# Patient Record
Sex: Female | Born: 1945
Health system: Southern US, Community
[De-identification: ages and names within clinical notes are randomized; demographics above are authoritative.]

## PROBLEM LIST (undated history)

## (undated) DIAGNOSIS — K219 Gastro-esophageal reflux disease without esophagitis: Secondary | ICD-10-CM

## (undated) DIAGNOSIS — F329 Major depressive disorder, single episode, unspecified: Secondary | ICD-10-CM

## (undated) DIAGNOSIS — F32A Depression, unspecified: Secondary | ICD-10-CM

## (undated) DIAGNOSIS — IMO0002 Reserved for concepts with insufficient information to code with codable children: Secondary | ICD-10-CM

## (undated) DIAGNOSIS — M8580 Other specified disorders of bone density and structure, unspecified site: Secondary | ICD-10-CM

## (undated) DIAGNOSIS — M1990 Unspecified osteoarthritis, unspecified site: Secondary | ICD-10-CM

## (undated) HISTORY — DX: Gastro-esophageal reflux disease without esophagitis: K21.9

## (undated) HISTORY — PX: TONSILLECTOMY AND ADENOIDECTOMY: SUR1326

## (undated) HISTORY — DX: Other specified disorders of bone density and structure, unspecified site: M85.80

## (undated) HISTORY — DX: Unspecified osteoarthritis, unspecified site: M19.90

## (undated) HISTORY — DX: Reserved for concepts with insufficient information to code with codable children: IMO0002

## (undated) HISTORY — DX: Major depressive disorder, single episode, unspecified: F32.9

## (undated) HISTORY — DX: Depression, unspecified: F32.A

---

## 1968-08-09 HISTORY — PX: OTHER SURGICAL HISTORY: SHX169

## 1997-08-09 HISTORY — PX: CHOLECYSTECTOMY: SHX55

## 1998-11-27 ENCOUNTER — Ambulatory Visit (HOSPITAL_BASED_OUTPATIENT_CLINIC_OR_DEPARTMENT_OTHER): Admission: RE | Admit: 1998-11-27 | Discharge: 1998-11-27 | Payer: Self-pay | Admitting: Orthopedic Surgery

## 1999-01-13 ENCOUNTER — Ambulatory Visit (HOSPITAL_BASED_OUTPATIENT_CLINIC_OR_DEPARTMENT_OTHER): Admission: RE | Admit: 1999-01-13 | Discharge: 1999-01-13 | Payer: Self-pay | Admitting: Orthopedic Surgery

## 2012-08-09 HISTORY — PX: CYSTOCELE REPAIR: SHX163

## 2013-01-02 DIAGNOSIS — R350 Frequency of micturition: Secondary | ICD-10-CM | POA: Diagnosis not present

## 2013-01-02 DIAGNOSIS — N949 Unspecified condition associated with female genital organs and menstrual cycle: Secondary | ICD-10-CM | POA: Diagnosis not present

## 2013-01-02 DIAGNOSIS — G4700 Insomnia, unspecified: Secondary | ICD-10-CM | POA: Diagnosis not present

## 2013-01-02 DIAGNOSIS — N8111 Cystocele, midline: Secondary | ICD-10-CM | POA: Diagnosis not present

## 2013-01-07 DIAGNOSIS — IMO0001 Reserved for inherently not codable concepts without codable children: Secondary | ICD-10-CM

## 2013-01-07 HISTORY — DX: Reserved for inherently not codable concepts without codable children: IMO0001

## 2013-01-13 DIAGNOSIS — L255 Unspecified contact dermatitis due to plants, except food: Secondary | ICD-10-CM | POA: Diagnosis not present

## 2013-01-16 DIAGNOSIS — Z01818 Encounter for other preprocedural examination: Secondary | ICD-10-CM | POA: Diagnosis not present

## 2013-01-22 DIAGNOSIS — N8111 Cystocele, midline: Secondary | ICD-10-CM | POA: Diagnosis not present

## 2013-01-22 DIAGNOSIS — Z884 Allergy status to anesthetic agent status: Secondary | ICD-10-CM | POA: Diagnosis not present

## 2013-01-22 DIAGNOSIS — Z9889 Other specified postprocedural states: Secondary | ICD-10-CM | POA: Diagnosis not present

## 2013-01-22 DIAGNOSIS — N8110 Cystocele, unspecified: Secondary | ICD-10-CM | POA: Diagnosis not present

## 2013-01-22 DIAGNOSIS — Z79899 Other long term (current) drug therapy: Secondary | ICD-10-CM | POA: Diagnosis not present

## 2013-01-23 DIAGNOSIS — Z79899 Other long term (current) drug therapy: Secondary | ICD-10-CM | POA: Diagnosis not present

## 2013-01-23 DIAGNOSIS — Z9889 Other specified postprocedural states: Secondary | ICD-10-CM | POA: Diagnosis not present

## 2013-01-23 DIAGNOSIS — N8111 Cystocele, midline: Secondary | ICD-10-CM | POA: Diagnosis not present

## 2013-01-23 DIAGNOSIS — Z884 Allergy status to anesthetic agent status: Secondary | ICD-10-CM | POA: Diagnosis not present

## 2013-04-17 DIAGNOSIS — N318 Other neuromuscular dysfunction of bladder: Secondary | ICD-10-CM | POA: Diagnosis not present

## 2013-04-17 DIAGNOSIS — N950 Postmenopausal bleeding: Secondary | ICD-10-CM | POA: Diagnosis not present

## 2013-05-07 ENCOUNTER — Other Ambulatory Visit: Payer: Self-pay

## 2013-05-07 DIAGNOSIS — Z1231 Encounter for screening mammogram for malignant neoplasm of breast: Secondary | ICD-10-CM

## 2013-05-08 ENCOUNTER — Other Ambulatory Visit: Payer: Self-pay | Admitting: Obstetrics and Gynecology

## 2013-05-08 DIAGNOSIS — N950 Postmenopausal bleeding: Secondary | ICD-10-CM

## 2013-05-15 ENCOUNTER — Ambulatory Visit
Admission: RE | Admit: 2013-05-15 | Discharge: 2013-05-15 | Disposition: A | Discharge status: Home / Self Care | Payer: Medicare Other | Source: Ambulatory Visit | Attending: Obstetrics and Gynecology | Admitting: Obstetrics and Gynecology

## 2013-05-15 DIAGNOSIS — N950 Postmenopausal bleeding: Secondary | ICD-10-CM

## 2013-05-15 DIAGNOSIS — K651 Peritoneal abscess: Secondary | ICD-10-CM | POA: Diagnosis not present

## 2013-05-17 ENCOUNTER — Ambulatory Visit
Admission: RE | Admit: 2013-05-17 | Discharge: 2013-05-17 | Disposition: A | Discharge status: Home / Self Care | Payer: Medicare Other | Source: Ambulatory Visit

## 2013-05-17 DIAGNOSIS — Z1231 Encounter for screening mammogram for malignant neoplasm of breast: Secondary | ICD-10-CM

## 2013-09-10 DIAGNOSIS — H1045 Other chronic allergic conjunctivitis: Secondary | ICD-10-CM | POA: Diagnosis not present

## 2013-09-10 DIAGNOSIS — H2589 Other age-related cataract: Secondary | ICD-10-CM | POA: Diagnosis not present

## 2013-09-10 DIAGNOSIS — H11159 Pinguecula, unspecified eye: Secondary | ICD-10-CM | POA: Diagnosis not present

## 2014-04-09 DIAGNOSIS — G5760 Lesion of plantar nerve, unspecified lower limb: Secondary | ICD-10-CM | POA: Diagnosis not present

## 2014-07-30 ENCOUNTER — Ambulatory Visit (INDEPENDENT_AMBULATORY_CARE_PROVIDER_SITE_OTHER): Payer: Medicare Other | Admitting: Family Medicine

## 2014-07-30 ENCOUNTER — Encounter: Payer: Self-pay | Admitting: Family Medicine

## 2014-07-30 VITALS — BP 122/63 | HR 80 | Ht 63.0 in | Wt 144.0 lb

## 2014-07-30 DIAGNOSIS — F32A Depression, unspecified: Secondary | ICD-10-CM | POA: Insufficient documentation

## 2014-07-30 DIAGNOSIS — G4700 Insomnia, unspecified: Secondary | ICD-10-CM | POA: Diagnosis not present

## 2014-07-30 DIAGNOSIS — F329 Major depressive disorder, single episode, unspecified: Secondary | ICD-10-CM | POA: Diagnosis not present

## 2014-07-30 DIAGNOSIS — L299 Pruritus, unspecified: Secondary | ICD-10-CM | POA: Diagnosis not present

## 2014-07-30 DIAGNOSIS — F5102 Adjustment insomnia: Secondary | ICD-10-CM | POA: Insufficient documentation

## 2014-07-30 DIAGNOSIS — F5104 Psychophysiologic insomnia: Secondary | ICD-10-CM | POA: Insufficient documentation

## 2014-07-30 MED ORDER — TRAZODONE HCL 50 MG PO TABS
25.0000 mg | ORAL_TABLET | Freq: Every evening | ORAL | Status: DC | PRN
Start: 2014-07-30 — End: 2014-09-10
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
## 2014-07-30 NOTE — Patient Instructions (Signed)
Insomnia Insomnia is frequent trouble falling and/or staying asleep. Insomnia can be a long term problem or a short term problem. Both are common. Insomnia can be a short term problem when the wakefulness is related to a certain stress or worry. Long term insomnia is often related to ongoing stress during waking hours and/or poor sleeping habits. Overtime, sleep deprivation itself can make the problem worse. Every little thing feels more severe because you are overtired and your ability to cope is decreased. CAUSES   Stress, anxiety, and depression.  Poor sleeping habits.  Distractions such as TV in the bedroom.  Naps close to bedtime.  Engaging in emotionally charged conversations before bed.  Technical reading before sleep.  Alcohol and other sedatives. They may make the problem worse. They can hurt normal sleep patterns and normal dream activity.  Stimulants such as caffeine for several hours prior to bedtime.  Pain syndromes and shortness of breath can cause insomnia.  Exercise late at night.  Changing time zones may cause sleeping problems (jet lag). It is sometimes helpful to have someone observe your sleeping patterns. They should look for periods of not breathing during the night (sleep apnea). They should also look to see how long those periods last. If you live alone or observers are uncertain, you can also be observed at a sleep clinic where your sleep patterns will be professionally monitored. Sleep apnea requires a checkup and treatment. Give your caregivers your medical history. Give your caregivers observations your family has made about your sleep.  SYMPTOMS   Not feeling rested in the morning.  Anxiety and restlessness at bedtime.  Difficulty falling and staying asleep. TREATMENT   Your caregiver may prescribe treatment for an underlying medical disorders. Your caregiver can give advice or help if you are using alcohol or other drugs for self-medication. Treatment  of underlying problems will usually eliminate insomnia problems.  Medications can be prescribed for short time use. They are generally not recommended for lengthy use.  Over-the-counter sleep medicines are not recommended for lengthy use. They can be habit forming.  You can promote easier sleeping by making lifestyle changes such as:  Using relaxation techniques that help with breathing and reduce muscle tension.  Exercising earlier in the day.  Changing your diet and the time of your last meal. No night time snacks.  Establish a regular time to go to bed.  Counseling can help with stressful problems and worry.  Soothing music and white noise may be helpful if there are background noises you cannot remove.  Stop tedious detailed work at least one hour before bedtime. HOME CARE INSTRUCTIONS   Keep a diary. Inform your caregiver about your progress. This includes any medication side effects. See your caregiver regularly. Take note of:  Times when you are asleep.  Times when you are awake during the night.  The quality of your sleep.  How you feel the next day. This information will help your caregiver care for you.  Get out of bed if you are still awake after 15 minutes. Read or do some quiet activity. Keep the lights down. Wait until you feel sleepy and go back to bed.  Keep regular sleeping and waking hours. Avoid naps.  Exercise regularly.  Avoid distractions at bedtime. Distractions include watching television or engaging in any intense or detailed activity like attempting to balance the household checkbook.  Develop a bedtime ritual. Keep a familiar routine of bathing, brushing your teeth, climbing into bed at the same   time each night, listening to soothing music. Routines increase the success of falling to sleep faster.  Use relaxation techniques. This can be using breathing and muscle tension release routines. It can also include visualizing peaceful scenes. You can  also help control troubling or intruding thoughts by keeping your mind occupied with boring or repetitive thoughts like the old concept of counting sheep. You can make it more creative like imagining planting one beautiful flower after another in your backyard garden.  During your day, work to eliminate stress. When this is not possible use some of the previous suggestions to help reduce the anxiety that accompanies stressful situations. MAKE SURE YOU:   Understand these instructions.  Will watch your condition.  Will get help right away if you are not doing well or get worse. Document Released: 07/23/2000 Document Revised: 10/18/2011 Document Reviewed: 08/23/2007 ExitCare Patient Information 2015 [employer]. This information is not intended to replace advice given to you by your health care provider. Make sure you discuss any questions you have with your health care provider.  

## 2014-07-31 LAB — TSH: TSH: 1.538 u[IU]/mL (ref 0.350–4.500)

## 2014-08-05 ENCOUNTER — Encounter: Payer: Self-pay | Admitting: Family Medicine

## 2014-09-09 LAB — HM DEXA SCAN

## 2014-09-10 ENCOUNTER — Encounter: Payer: Self-pay | Admitting: Family Medicine

## 2014-09-10 ENCOUNTER — Ambulatory Visit (INDEPENDENT_AMBULATORY_CARE_PROVIDER_SITE_OTHER): Payer: Medicare Other | Admitting: Family Medicine

## 2014-09-10 VITALS — BP 134/69 | HR 65 | Ht 63.0 in | Wt 142.0 lb

## 2014-09-10 DIAGNOSIS — Z780 Asymptomatic menopausal state: Secondary | ICD-10-CM

## 2014-09-10 DIAGNOSIS — F32A Depression, unspecified: Secondary | ICD-10-CM

## 2014-09-10 DIAGNOSIS — Z0000 Encounter for general adult medical examination without abnormal findings: Secondary | ICD-10-CM | POA: Diagnosis not present

## 2014-09-10 DIAGNOSIS — F329 Major depressive disorder, single episode, unspecified: Secondary | ICD-10-CM

## 2014-09-10 DIAGNOSIS — Z1231 Encounter for screening mammogram for malignant neoplasm of breast: Secondary | ICD-10-CM

## 2014-09-10 DIAGNOSIS — G4700 Insomnia, unspecified: Secondary | ICD-10-CM

## 2014-09-10 DIAGNOSIS — Z13220 Encounter for screening for lipoid disorders: Secondary | ICD-10-CM

## 2014-09-10 DIAGNOSIS — Z833 Family history of diabetes mellitus: Secondary | ICD-10-CM

## 2014-09-10 LAB — POCT GLYCOSYLATED HEMOGLOBIN (HGB A1C): Hemoglobin A1C: 5.4

## 2014-09-10 NOTE — Patient Instructions (Signed)
complete physical examination Keep up a regular exercise program and make sure you are eating a healthy diet Try to eat 4 servings of dairy a day, or if you are lactose intolerant take a calcium with vitamin D daily.  Your vaccines are up to date.   

## 2014-09-10 NOTE — Progress Notes (Signed)
Subjective:    Diane Murphy is a 69 y.o. female who presents for Medicare Annual/Subsequent preventive examination.  Preventive Screening-Counseling & Management  Tobacco History  Smoking status  . Never Smoker   Smokeless tobacco  . Not on file     Problems Prior to Visit 1. Depression 2. insomnia  Current Problems (verified) Patient Active Problem List   Diagnosis Date Noted  . Depression 07/30/2014  . Insomnia 07/30/2014    Medications Prior to Visit Current Outpatient Prescriptions on File Prior to Visit  Medication Sig Dispense Refill  . BIOTIN PO Take by mouth.    . Cholecalciferol ([employer]S VIT D 5000 HIGH-POTENCY PO) Take by mouth.    . Cranberry-Vit C-Lactobacillus (CRANBERRY/PROBIOTIC/VIT C PO) Take by mouth.    . Multiple Vitamin (MULTIVITAMIN) tablet Take 1 tablet by mouth.    . Omega-3 Fatty Acids (FISH OIL PO) Take by mouth.    . traZODone (DESYREL) 50 MG tablet Take 0.5-2 tablets (25-100 mg total) by mouth at bedtime as needed for sleep. 60 tablet 1   No current facility-administered medications on file prior to visit.    Current Medications (verified) Current Outpatient Prescriptions  Medication Sig Dispense Refill  . BIOTIN PO Take by mouth.    . Cholecalciferol (CVS VIT D 5000 HIGH-POTENCY PO) Take by mouth.    . Cranberry-Vit C-Lactobacillus (CRANBERRY/PROBIOTIC/VIT C PO) Take by mouth.    . Multiple Vitamin (MULTIVITAMIN) tablet Take 1 tablet by mouth.    . Omega-3 Fatty Acids (FISH OIL PO) Take by mouth.    . traZODone (DESYREL) 50 MG tablet Take 0.5-2 tablets (25-100 mg total) by mouth at bedtime as needed for sleep. 60 tablet 1   No current facility-administered medications for this visit.     Allergies (verified) Ether   PAST HISTORY  Family History Family History  Problem Relation Age of Onset  . Alcoholism Mother   . Depression Mother   . Diabetes Father     Social History History  Substance Use Topics  . Smoking status:  Never Smoker   . Smokeless tobacco: Not on file  . Alcohol Use: No     Are there smokers in your home (other than you)? No  Risk Factors Current exercise habits: occ waking  Dietary issues discussed: None   Cardiac risk factors: none.  Depression Screen (Note: if answer to either of the following is "Yes", a more complete depression screening is indicated)   Over the past two weeks, have you felt down, depressed or hopeless? Yes  Over the past two weeks, have you felt little interest or pleasure in doing things? Yes  Have you lost interest or pleasure in daily life? Yes  Do you often feel hopeless? Yes  Do you cry easily over simple problems? Yes  Activities of Daily Living In your present state of health, do you have any difficulty performing the following activities?:  Driving? No Managing money?  No Feeding yourself? No Getting from bed to chair? No Climbing a flight of stairs? No Preparing food and eating?: No Bathing or showering? No Getting dressed: No Getting to the toilet? No Using the toilet:No Moving around from place to place: No In the past year have you fallen or had a near fall?:Yes   Are you sexually active?  No  Do you have more than one partner?  No  Hearing Difficulties: No Do you often ask people to speak up or repeat themselves? No Do you experience ringing or noises  in your ears? No Do you have difficulty understanding soft or whispered voices? Yes   Do you feel that you have a problem with memory? No  Do you often misplace items? No  Do you feel safe at home?  Yes  Cognitive Testing  Alert? Yes  Normal Appearance?Yes  Oriented to person? Yes  Place? Yes   Time? Yes  Recall of three objects?  Yes  Can perform simple calculations? Yes  Displays appropriate judgment?Yes  Can read the correct time from a watch face?Yes   Advanced Directives have been discussed with the patient? No  List the Names of Other Physician/Practitioners you  currently use: 1.    Indicate any recent Medical Services you may have received from other than [facility] providers in the past year (date may be approximate).   There is no immunization history on file for this patient.  Screening Tests Health Maintenance  Topic Date Due  . TETANUS/TDAP  08/05/1963  . COLONOSCOPY  08/04/1994  . ZOSTAVAX  08/04/2004  . DEXA SCAN  08/04/2009  . PNEUMOCOCCAL POLYSACCHARIDE VACCINE AGE 58 AND OVER  08/04/2009  . INFLUENZA VACCINE  03/09/2014  . MAMMOGRAM  05/18/2015    All answers were reviewed with the patient and necessary referrals were made:  METHENEY,CATHERINE, MD   09/10/2014   History reviewed: allergies, current medications, past family history, past medical history, past social history, past surgical history and problem list  Review of Systems A comprehensive review of systems was negative.    Objective:     Vision by Snellen chart: formal eye exam scheduled next week with her optometrist.   Body mass index is 25.16 kg/(m^2). BP 134/69 mmHg  Pulse 65  Ht 5\' 3"  (1.6 m)  Wt 142 lb (64.411 kg)  BMI 25.16 kg/m2  SpO2 100%  BP 134/69 mmHg  Pulse 65  Ht 5\' 3"  (1.6 m)  Wt 142 lb (64.411 kg)  BMI 25.16 kg/m2  SpO2 100%  General Appearance:    Alert, cooperative, no distress, appears stated age  Head:    Normocephalic, without obvious abnormality, atraumatic  Eyes:    PERRL, conjunctiva/corneas clear, EOM's intact, both eyes  Ears:    Normal TM's and external ear canals, both ears  Nose:   Nares normal, septum midline, mucosa normal, no drainage    or sinus tenderness  Throat:   Lips, mucosa, and tongue normal; teeth and gums normal  Neck:   Supple, symmetrical, trachea midline, no adenopathy;    thyroid:  no enlargement/tenderness/nodules; no carotid   bruit or JVD  Back:     Symmetric, no curvature, ROM normal, no CVA tenderness  Lungs:     Clear to auscultation bilaterally, respirations unlabored  Chest Wall:    No tenderness or  deformity   Heart:    Regular rate and rhythm, S1 and S2 normal, no murmur, rub   or gallop  Breast Exam:    No tenderness, masses, or nipple abnormality  Abdomen:     Soft, non-tender, bowel sounds active all four quadrants,    no masses, no organomegaly  Genitalia:    Not performed.   Rectal:    Not performed  Extremities:   Extremities normal, atraumatic, no cyanosis or edema  Pulses:   2+ and symmetric all extremities  Skin:   Skin color, texture, turgor normal, no rashes or lesions  Lymph nodes:   Cervical, supraclavicular, and axillary nodes normal  Neurologic:   CNII-XII intact, normal strength, sensation and  reflexes    throughout       Assessment:     Annual Wellness Exam       Plan:     During the course of the visit the patient was educated and counseled about appropriate screening and preventive services including:    Pneumococcal vaccine   Influenza vaccine  Td vaccine   Declined all vaccines   Eye exam scheduled next week  Due for mammogram.    Insomnia - says the trazodone made her feel more sad.  Says hard time with waking up frequently.  Discussed with her that I really think her sleep issues are probably related directly to her depression.  Depression-uncontrolled. PHQ 9 score of 11 today. She rates her symptoms is somewhat difficult. She says she plans on seeing a counselor through her church. Encouraged her to call me if she needs any additional resources or referrals.     Diet review for nutrition referral? Yes ____  Not Indicated _X__   Patient Instructions (the written plan) was given to the patient.  Medicare Attestation I have personally reviewed: The patient's medical and social history Their use of alcohol, tobacco or illicit drugs Their current medications and supplements The patient's functional ability including ADLs,fall risks, home safety risks, cognitive, and hearing and visual impairment Diet and physical  activities Evidence for depression or mood disorders  The patient's weight, height, BMI, and visual acuity have been recorded in the chart.  I have made referrals, counseling, and provided education to the patient based on review of the above and I have provided the patient with a written personalized care plan for preventive services.     METHENEY,CATHERINE, MD   09/10/2014

## 2014-09-17 DIAGNOSIS — H10523 Angular blepharoconjunctivitis, bilateral: Secondary | ICD-10-CM | POA: Diagnosis not present

## 2014-09-17 DIAGNOSIS — H2513 Age-related nuclear cataract, bilateral: Secondary | ICD-10-CM | POA: Diagnosis not present

## 2014-09-17 DIAGNOSIS — H10413 Chronic giant papillary conjunctivitis, bilateral: Secondary | ICD-10-CM | POA: Diagnosis not present

## 2014-09-26 ENCOUNTER — Ambulatory Visit
Admission: RE | Admit: 2014-09-26 | Discharge: 2014-09-26 | Disposition: A | Discharge status: Home / Self Care | Payer: Medicare Other | Source: Ambulatory Visit | Attending: Family Medicine | Admitting: Family Medicine

## 2014-09-26 DIAGNOSIS — Z780 Asymptomatic menopausal state: Secondary | ICD-10-CM

## 2014-09-26 DIAGNOSIS — M899 Disorder of bone, unspecified: Secondary | ICD-10-CM | POA: Diagnosis not present

## 2014-09-26 DIAGNOSIS — Z1231 Encounter for screening mammogram for malignant neoplasm of breast: Secondary | ICD-10-CM | POA: Diagnosis not present

## 2014-09-29 ENCOUNTER — Encounter: Payer: Self-pay | Admitting: Family Medicine

## 2014-09-29 DIAGNOSIS — M8580 Other specified disorders of bone density and structure, unspecified site: Secondary | ICD-10-CM | POA: Insufficient documentation

## 2014-09-30 ENCOUNTER — Other Ambulatory Visit: Payer: Self-pay | Admitting: *Deleted

## 2014-09-30 ENCOUNTER — Telehealth: Payer: Self-pay | Admitting: *Deleted

## 2014-09-30 NOTE — Telephone Encounter (Signed)
-----   Message from Hali Marry, MD sent at 09/29/2014  9:46 PM EST ----- Call pt: bone density shows mildly think bones called osteopenia. Recommedn calcium 1200mg  daily and vit D 800 IU daily and regular exercise.

## 2014-09-30 NOTE — Telephone Encounter (Signed)
[phone] (Home) [phone] (Mobile)  Left VM about results

## 2014-10-03 DIAGNOSIS — M5136 Other intervertebral disc degeneration, lumbar region: Secondary | ICD-10-CM | POA: Diagnosis not present

## 2014-10-03 DIAGNOSIS — M9903 Segmental and somatic dysfunction of lumbar region: Secondary | ICD-10-CM | POA: Diagnosis not present

## 2014-10-03 DIAGNOSIS — M9901 Segmental and somatic dysfunction of cervical region: Secondary | ICD-10-CM | POA: Diagnosis not present

## 2014-10-03 DIAGNOSIS — Q72811 Congenital shortening of right lower limb: Secondary | ICD-10-CM | POA: Diagnosis not present

## 2014-10-03 DIAGNOSIS — M9905 Segmental and somatic dysfunction of pelvic region: Secondary | ICD-10-CM | POA: Diagnosis not present

## 2014-10-03 DIAGNOSIS — M5384 Other specified dorsopathies, thoracic region: Secondary | ICD-10-CM | POA: Diagnosis not present

## 2014-10-03 DIAGNOSIS — M5032 Other cervical disc degeneration, mid-cervical region: Secondary | ICD-10-CM | POA: Diagnosis not present

## 2014-10-03 DIAGNOSIS — M9902 Segmental and somatic dysfunction of thoracic region: Secondary | ICD-10-CM | POA: Diagnosis not present

## 2014-10-21 DIAGNOSIS — M4120 Other idiopathic scoliosis, site unspecified: Secondary | ICD-10-CM | POA: Diagnosis not present

## 2014-10-22 DIAGNOSIS — R52 Pain, unspecified: Secondary | ICD-10-CM | POA: Diagnosis not present

## 2014-10-22 DIAGNOSIS — M419 Scoliosis, unspecified: Secondary | ICD-10-CM | POA: Diagnosis not present

## 2014-10-22 DIAGNOSIS — M4004 Postural kyphosis, thoracic region: Secondary | ICD-10-CM | POA: Diagnosis not present

## 2014-10-22 DIAGNOSIS — M62830 Muscle spasm of back: Secondary | ICD-10-CM | POA: Diagnosis not present

## 2014-10-24 DIAGNOSIS — M62830 Muscle spasm of back: Secondary | ICD-10-CM | POA: Diagnosis not present

## 2014-10-24 DIAGNOSIS — M4004 Postural kyphosis, thoracic region: Secondary | ICD-10-CM | POA: Diagnosis not present

## 2014-10-24 DIAGNOSIS — R52 Pain, unspecified: Secondary | ICD-10-CM | POA: Diagnosis not present

## 2014-10-24 DIAGNOSIS — M419 Scoliosis, unspecified: Secondary | ICD-10-CM | POA: Diagnosis not present

## 2014-10-29 DIAGNOSIS — M4004 Postural kyphosis, thoracic region: Secondary | ICD-10-CM | POA: Diagnosis not present

## 2014-10-29 DIAGNOSIS — M62830 Muscle spasm of back: Secondary | ICD-10-CM | POA: Diagnosis not present

## 2014-10-29 DIAGNOSIS — R52 Pain, unspecified: Secondary | ICD-10-CM | POA: Diagnosis not present

## 2014-10-29 DIAGNOSIS — M419 Scoliosis, unspecified: Secondary | ICD-10-CM | POA: Diagnosis not present

## 2014-10-31 DIAGNOSIS — M62830 Muscle spasm of back: Secondary | ICD-10-CM | POA: Diagnosis not present

## 2014-10-31 DIAGNOSIS — M419 Scoliosis, unspecified: Secondary | ICD-10-CM | POA: Diagnosis not present

## 2014-10-31 DIAGNOSIS — M4004 Postural kyphosis, thoracic region: Secondary | ICD-10-CM | POA: Diagnosis not present

## 2014-10-31 DIAGNOSIS — R52 Pain, unspecified: Secondary | ICD-10-CM | POA: Diagnosis not present

## 2014-11-04 DIAGNOSIS — M4004 Postural kyphosis, thoracic region: Secondary | ICD-10-CM | POA: Diagnosis not present

## 2014-11-04 DIAGNOSIS — R52 Pain, unspecified: Secondary | ICD-10-CM | POA: Diagnosis not present

## 2014-11-04 DIAGNOSIS — M419 Scoliosis, unspecified: Secondary | ICD-10-CM | POA: Diagnosis not present

## 2014-11-04 DIAGNOSIS — M62830 Muscle spasm of back: Secondary | ICD-10-CM | POA: Diagnosis not present

## 2014-11-07 DIAGNOSIS — M419 Scoliosis, unspecified: Secondary | ICD-10-CM | POA: Diagnosis not present

## 2014-11-07 DIAGNOSIS — M62830 Muscle spasm of back: Secondary | ICD-10-CM | POA: Diagnosis not present

## 2014-11-07 DIAGNOSIS — M7741 Metatarsalgia, right foot: Secondary | ICD-10-CM | POA: Diagnosis not present

## 2014-11-07 DIAGNOSIS — M4004 Postural kyphosis, thoracic region: Secondary | ICD-10-CM | POA: Diagnosis not present

## 2014-11-07 DIAGNOSIS — M6701 Short Achilles tendon (acquired), right ankle: Secondary | ICD-10-CM | POA: Diagnosis not present

## 2014-11-07 DIAGNOSIS — M2042 Other hammer toe(s) (acquired), left foot: Secondary | ICD-10-CM | POA: Diagnosis not present

## 2014-11-07 DIAGNOSIS — R52 Pain, unspecified: Secondary | ICD-10-CM | POA: Diagnosis not present

## 2014-11-07 DIAGNOSIS — M2041 Other hammer toe(s) (acquired), right foot: Secondary | ICD-10-CM | POA: Diagnosis not present

## 2014-11-11 DIAGNOSIS — R52 Pain, unspecified: Secondary | ICD-10-CM | POA: Diagnosis not present

## 2014-11-11 DIAGNOSIS — M62830 Muscle spasm of back: Secondary | ICD-10-CM | POA: Diagnosis not present

## 2014-11-11 DIAGNOSIS — M4004 Postural kyphosis, thoracic region: Secondary | ICD-10-CM | POA: Diagnosis not present

## 2014-11-11 DIAGNOSIS — M419 Scoliosis, unspecified: Secondary | ICD-10-CM | POA: Diagnosis not present

## 2014-11-14 DIAGNOSIS — M419 Scoliosis, unspecified: Secondary | ICD-10-CM | POA: Diagnosis not present

## 2014-11-14 DIAGNOSIS — M62830 Muscle spasm of back: Secondary | ICD-10-CM | POA: Diagnosis not present

## 2014-11-14 DIAGNOSIS — R52 Pain, unspecified: Secondary | ICD-10-CM | POA: Diagnosis not present

## 2014-11-14 DIAGNOSIS — M4004 Postural kyphosis, thoracic region: Secondary | ICD-10-CM | POA: Diagnosis not present

## 2014-11-18 DIAGNOSIS — M47812 Spondylosis without myelopathy or radiculopathy, cervical region: Secondary | ICD-10-CM | POA: Diagnosis not present

## 2014-11-19 DIAGNOSIS — M542 Cervicalgia: Secondary | ICD-10-CM | POA: Diagnosis not present

## 2014-11-19 DIAGNOSIS — M6281 Muscle weakness (generalized): Secondary | ICD-10-CM | POA: Diagnosis not present

## 2014-11-19 DIAGNOSIS — M419 Scoliosis, unspecified: Secondary | ICD-10-CM | POA: Diagnosis not present

## 2014-11-19 DIAGNOSIS — M62838 Other muscle spasm: Secondary | ICD-10-CM | POA: Diagnosis not present

## 2014-11-21 DIAGNOSIS — M419 Scoliosis, unspecified: Secondary | ICD-10-CM | POA: Diagnosis not present

## 2014-11-21 DIAGNOSIS — M542 Cervicalgia: Secondary | ICD-10-CM | POA: Diagnosis not present

## 2014-11-21 DIAGNOSIS — M6281 Muscle weakness (generalized): Secondary | ICD-10-CM | POA: Diagnosis not present

## 2014-11-21 DIAGNOSIS — M62838 Other muscle spasm: Secondary | ICD-10-CM | POA: Diagnosis not present

## 2014-11-25 DIAGNOSIS — M62838 Other muscle spasm: Secondary | ICD-10-CM | POA: Diagnosis not present

## 2014-11-25 DIAGNOSIS — M419 Scoliosis, unspecified: Secondary | ICD-10-CM | POA: Diagnosis not present

## 2014-11-25 DIAGNOSIS — M6281 Muscle weakness (generalized): Secondary | ICD-10-CM | POA: Diagnosis not present

## 2014-11-25 DIAGNOSIS — M542 Cervicalgia: Secondary | ICD-10-CM | POA: Diagnosis not present

## 2014-11-28 DIAGNOSIS — M62838 Other muscle spasm: Secondary | ICD-10-CM | POA: Diagnosis not present

## 2014-11-28 DIAGNOSIS — M6281 Muscle weakness (generalized): Secondary | ICD-10-CM | POA: Diagnosis not present

## 2014-11-28 DIAGNOSIS — M542 Cervicalgia: Secondary | ICD-10-CM | POA: Diagnosis not present

## 2014-11-28 DIAGNOSIS — M419 Scoliosis, unspecified: Secondary | ICD-10-CM | POA: Diagnosis not present

## 2014-12-02 DIAGNOSIS — M6281 Muscle weakness (generalized): Secondary | ICD-10-CM | POA: Diagnosis not present

## 2014-12-02 DIAGNOSIS — M419 Scoliosis, unspecified: Secondary | ICD-10-CM | POA: Diagnosis not present

## 2014-12-02 DIAGNOSIS — M542 Cervicalgia: Secondary | ICD-10-CM | POA: Diagnosis not present

## 2014-12-02 DIAGNOSIS — M62838 Other muscle spasm: Secondary | ICD-10-CM | POA: Diagnosis not present

## 2014-12-05 DIAGNOSIS — M2042 Other hammer toe(s) (acquired), left foot: Secondary | ICD-10-CM | POA: Diagnosis not present

## 2014-12-05 DIAGNOSIS — M6701 Short Achilles tendon (acquired), right ankle: Secondary | ICD-10-CM | POA: Diagnosis not present

## 2014-12-05 DIAGNOSIS — M2041 Other hammer toe(s) (acquired), right foot: Secondary | ICD-10-CM | POA: Diagnosis not present

## 2014-12-05 DIAGNOSIS — M7741 Metatarsalgia, right foot: Secondary | ICD-10-CM | POA: Diagnosis not present

## 2014-12-16 DIAGNOSIS — M419 Scoliosis, unspecified: Secondary | ICD-10-CM | POA: Diagnosis not present

## 2014-12-17 ENCOUNTER — Ambulatory Visit: Payer: Medicare Other | Admitting: Family Medicine

## 2015-06-30 DIAGNOSIS — M5030 Other cervical disc degeneration, unspecified cervical region: Secondary | ICD-10-CM | POA: Diagnosis not present

## 2015-06-30 DIAGNOSIS — M25512 Pain in left shoulder: Secondary | ICD-10-CM | POA: Diagnosis not present

## 2015-07-09 DIAGNOSIS — M542 Cervicalgia: Secondary | ICD-10-CM | POA: Diagnosis not present

## 2015-07-14 DIAGNOSIS — M542 Cervicalgia: Secondary | ICD-10-CM | POA: Diagnosis not present

## 2015-07-16 DIAGNOSIS — M542 Cervicalgia: Secondary | ICD-10-CM | POA: Diagnosis not present

## 2015-07-22 DIAGNOSIS — M542 Cervicalgia: Secondary | ICD-10-CM | POA: Diagnosis not present

## 2015-07-24 DIAGNOSIS — M542 Cervicalgia: Secondary | ICD-10-CM | POA: Diagnosis not present

## 2015-07-28 DIAGNOSIS — M542 Cervicalgia: Secondary | ICD-10-CM | POA: Diagnosis not present

## 2015-07-31 DIAGNOSIS — M542 Cervicalgia: Secondary | ICD-10-CM | POA: Diagnosis not present

## 2015-08-06 DIAGNOSIS — M542 Cervicalgia: Secondary | ICD-10-CM | POA: Diagnosis not present

## 2015-08-08 DIAGNOSIS — M542 Cervicalgia: Secondary | ICD-10-CM | POA: Diagnosis not present

## 2015-08-13 DIAGNOSIS — M542 Cervicalgia: Secondary | ICD-10-CM | POA: Diagnosis not present

## 2015-08-15 DIAGNOSIS — M542 Cervicalgia: Secondary | ICD-10-CM | POA: Diagnosis not present

## 2015-08-22 DIAGNOSIS — M542 Cervicalgia: Secondary | ICD-10-CM | POA: Diagnosis not present

## 2015-09-03 DIAGNOSIS — M542 Cervicalgia: Secondary | ICD-10-CM | POA: Diagnosis not present

## 2015-09-03 DIAGNOSIS — M25512 Pain in left shoulder: Secondary | ICD-10-CM | POA: Diagnosis not present

## 2015-09-04 DIAGNOSIS — M542 Cervicalgia: Secondary | ICD-10-CM | POA: Diagnosis not present

## 2015-09-08 DIAGNOSIS — M542 Cervicalgia: Secondary | ICD-10-CM | POA: Diagnosis not present

## 2015-09-09 DIAGNOSIS — F5101 Primary insomnia: Secondary | ICD-10-CM | POA: Diagnosis not present

## 2015-09-09 DIAGNOSIS — M150 Primary generalized (osteo)arthritis: Secondary | ICD-10-CM | POA: Insufficient documentation

## 2015-09-09 DIAGNOSIS — M8580 Other specified disorders of bone density and structure, unspecified site: Secondary | ICD-10-CM | POA: Diagnosis not present

## 2015-09-09 DIAGNOSIS — F39 Unspecified mood [affective] disorder: Secondary | ICD-10-CM | POA: Diagnosis not present

## 2015-09-09 DIAGNOSIS — M542 Cervicalgia: Secondary | ICD-10-CM | POA: Diagnosis not present

## 2015-09-09 DIAGNOSIS — F338 Other recurrent depressive disorders: Secondary | ICD-10-CM | POA: Insufficient documentation

## 2015-09-09 DIAGNOSIS — M549 Dorsalgia, unspecified: Secondary | ICD-10-CM | POA: Diagnosis not present

## 2015-09-09 DIAGNOSIS — Z1211 Encounter for screening for malignant neoplasm of colon: Secondary | ICD-10-CM | POA: Diagnosis not present

## 2015-09-09 DIAGNOSIS — M8949 Other hypertrophic osteoarthropathy, multiple sites: Secondary | ICD-10-CM | POA: Insufficient documentation

## 2015-09-09 DIAGNOSIS — M159 Polyosteoarthritis, unspecified: Secondary | ICD-10-CM | POA: Insufficient documentation

## 2015-09-09 DIAGNOSIS — K219 Gastro-esophageal reflux disease without esophagitis: Secondary | ICD-10-CM | POA: Diagnosis not present

## 2015-09-11 DIAGNOSIS — M542 Cervicalgia: Secondary | ICD-10-CM | POA: Diagnosis not present

## 2015-09-15 DIAGNOSIS — Z1212 Encounter for screening for malignant neoplasm of rectum: Secondary | ICD-10-CM | POA: Diagnosis not present

## 2015-09-15 DIAGNOSIS — M542 Cervicalgia: Secondary | ICD-10-CM | POA: Diagnosis not present

## 2015-09-15 DIAGNOSIS — Z1211 Encounter for screening for malignant neoplasm of colon: Secondary | ICD-10-CM | POA: Diagnosis not present

## 2015-09-17 DIAGNOSIS — M47812 Spondylosis without myelopathy or radiculopathy, cervical region: Secondary | ICD-10-CM | POA: Diagnosis not present

## 2015-09-17 DIAGNOSIS — M542 Cervicalgia: Secondary | ICD-10-CM | POA: Diagnosis not present

## 2015-09-18 DIAGNOSIS — M542 Cervicalgia: Secondary | ICD-10-CM | POA: Diagnosis not present

## 2015-09-22 DIAGNOSIS — M542 Cervicalgia: Secondary | ICD-10-CM | POA: Diagnosis not present

## 2015-09-26 DIAGNOSIS — M542 Cervicalgia: Secondary | ICD-10-CM | POA: Diagnosis not present

## 2015-09-30 ENCOUNTER — Other Ambulatory Visit: Payer: Self-pay

## 2015-09-30 DIAGNOSIS — Z1231 Encounter for screening mammogram for malignant neoplasm of breast: Secondary | ICD-10-CM

## 2015-09-30 DIAGNOSIS — H2513 Age-related nuclear cataract, bilateral: Secondary | ICD-10-CM | POA: Diagnosis not present

## 2015-09-30 DIAGNOSIS — H10413 Chronic giant papillary conjunctivitis, bilateral: Secondary | ICD-10-CM | POA: Diagnosis not present

## 2015-09-30 DIAGNOSIS — H04123 Dry eye syndrome of bilateral lacrimal glands: Secondary | ICD-10-CM | POA: Diagnosis not present

## 2015-09-30 LAB — COLOGUARD: Cologuard: NEGATIVE

## 2015-10-02 DIAGNOSIS — M542 Cervicalgia: Secondary | ICD-10-CM | POA: Diagnosis not present

## 2015-10-13 ENCOUNTER — Ambulatory Visit
Admission: RE | Admit: 2015-10-13 | Discharge: 2015-10-13 | Disposition: A | Discharge status: Home / Self Care | Payer: Medicare Other | Source: Ambulatory Visit

## 2015-10-13 DIAGNOSIS — Z1231 Encounter for screening mammogram for malignant neoplasm of breast: Secondary | ICD-10-CM | POA: Diagnosis not present

## 2015-10-14 LAB — HM MAMMOGRAPHY

## 2015-11-14 DIAGNOSIS — Z13220 Encounter for screening for lipoid disorders: Secondary | ICD-10-CM | POA: Diagnosis not present

## 2015-11-14 DIAGNOSIS — Z136 Encounter for screening for cardiovascular disorders: Secondary | ICD-10-CM | POA: Diagnosis not present

## 2015-11-14 DIAGNOSIS — M8580 Other specified disorders of bone density and structure, unspecified site: Secondary | ICD-10-CM | POA: Diagnosis not present

## 2015-11-14 DIAGNOSIS — F5101 Primary insomnia: Secondary | ICD-10-CM | POA: Diagnosis not present

## 2015-11-14 DIAGNOSIS — K219 Gastro-esophageal reflux disease without esophagitis: Secondary | ICD-10-CM | POA: Diagnosis not present

## 2015-11-20 DIAGNOSIS — M150 Primary generalized (osteo)arthritis: Secondary | ICD-10-CM | POA: Diagnosis not present

## 2015-11-20 DIAGNOSIS — K219 Gastro-esophageal reflux disease without esophagitis: Secondary | ICD-10-CM | POA: Diagnosis not present

## 2015-11-20 DIAGNOSIS — Z1211 Encounter for screening for malignant neoplasm of colon: Secondary | ICD-10-CM | POA: Diagnosis not present

## 2015-11-20 DIAGNOSIS — Z136 Encounter for screening for cardiovascular disorders: Secondary | ICD-10-CM | POA: Diagnosis not present

## 2015-11-20 DIAGNOSIS — F5101 Primary insomnia: Secondary | ICD-10-CM | POA: Diagnosis not present

## 2015-11-20 DIAGNOSIS — M8580 Other specified disorders of bone density and structure, unspecified site: Secondary | ICD-10-CM | POA: Diagnosis not present

## 2015-12-17 DIAGNOSIS — M659 Synovitis and tenosynovitis, unspecified: Secondary | ICD-10-CM | POA: Diagnosis not present

## 2015-12-17 DIAGNOSIS — M19071 Primary osteoarthritis, right ankle and foot: Secondary | ICD-10-CM | POA: Diagnosis not present

## 2015-12-17 DIAGNOSIS — M19072 Primary osteoarthritis, left ankle and foot: Secondary | ICD-10-CM | POA: Diagnosis not present

## 2015-12-17 DIAGNOSIS — M76821 Posterior tibial tendinitis, right leg: Secondary | ICD-10-CM | POA: Diagnosis not present

## 2015-12-23 DIAGNOSIS — L0100 Impetigo, unspecified: Secondary | ICD-10-CM | POA: Diagnosis not present

## 2015-12-29 DIAGNOSIS — M76821 Posterior tibial tendinitis, right leg: Secondary | ICD-10-CM | POA: Diagnosis not present

## 2016-02-11 ENCOUNTER — Ambulatory Visit (HOSPITAL_BASED_OUTPATIENT_CLINIC_OR_DEPARTMENT_OTHER): Payer: Medicare Other | Attending: Family Medicine | Admitting: Internal Medicine

## 2016-02-11 VITALS — Ht 63.0 in | Wt 145.0 lb

## 2016-02-11 DIAGNOSIS — G4730 Sleep apnea, unspecified: Secondary | ICD-10-CM | POA: Insufficient documentation

## 2016-02-11 DIAGNOSIS — G4761 Periodic limb movement disorder: Secondary | ICD-10-CM | POA: Insufficient documentation

## 2016-02-11 DIAGNOSIS — R0683 Snoring: Secondary | ICD-10-CM | POA: Insufficient documentation

## 2016-02-11 DIAGNOSIS — G4700 Insomnia, unspecified: Secondary | ICD-10-CM | POA: Insufficient documentation

## 2016-02-14 DIAGNOSIS — G4761 Periodic limb movement disorder: Secondary | ICD-10-CM | POA: Diagnosis not present

## 2016-02-14 NOTE — Procedures (Signed)
  Patient Name: Diane Murphy, Diane Murphy Date: 02/11/2016 Gender: Female D.O.B: [birth year] Age (years): 39 Referring Provider: Leamon Arnt Height (inches): 63 Interpreting Physician: Baird Lyons MD, ABSM Weight (lbs): 145 RPSGT: Earney [place] BMI: 26 MRN: [id number] Neck Size: 13.50 CLINICAL INFORMATION Sleep Study Type: NPSG   Indication for sleep study: Insomnia with sleep apnea   Epworth Sleepiness Score: 6   SLEEP STUDY TECHNIQUE As per the AASM Manual for the Scoring of Sleep and Associated Events v2.3 (April 2016) with a hypopnea requiring 4% desaturations. The channels recorded and monitored were frontal, central and occipital EEG, electrooculogram (EOG), submentalis EMG (chin), nasal and oral airflow, thoracic and abdominal wall motion, anterior tibialis EMG, snore microphone, electrocardiogram, and pulse oximetry.  MEDICATIONS Patient's medications include: charted for review. Medications self-administered by patient during sleep study : No sleep medicine administered.  SLEEP ARCHITECTURE The study was initiated at 10:31:07 PM and ended at 5:01:34 AM. Sleep onset time was 29.8 minutes and the sleep efficiency was 68.3%. The total sleep time was 266.7 minutes. Stage REM latency was 110.0 minutes. The patient spent 5.25% of the night in stage N1 sleep, 82.75% in stage N2 sleep, 0.00% in stage N3 and 12.00% in REM. Alpha intrusion was absent. Supine sleep was 54.94%.  RESPIRATORY PARAMETERS The overall apnea/hypopnea index (AHI) was 3.6 per hour. There were 0 total apneas, including 0 obstructive, 0 central and 0 mixed apneas. There were 16 hypopneas and 4 RERAs. The AHI during Stage REM sleep was 28.1 per hour. AHI while supine was 6.6 per hour. The mean oxygen saturation was 92.88%. The minimum SpO2 during sleep was 88.00%. Soft snoring was noted during this study.  CARDIAC DATA The 2 lead EKG demonstrated sinus rhythm. The mean heart rate was 67.09 beats  per minute. Other EKG findings include: None.  LEG MOVEMENT DATA The total PLMS were 63 with a resulting PLMS index of 14.18. Associated arousal with leg movement index was 1.4 .  IMPRESSIONS - No significant obstructive sleep apnea occurred during this study (AHI = 3.6/h). - No significant central sleep apnea occurred during this study (CAI = 0.0/h). - The patient had minimal or no oxygen desaturation during the study (Min O2 = 88.00%) - The patient snored with Soft snoring volume. - No cardiac abnormalities were noted during this study. - Mild periodic limb movements of sleep occurred during the study. No significant associated arousals.  DIAGNOSIS - Periodic Limb Movement Syndrome (327.51 [G47.61 ICD-10]) - Normal study  RECOMMENDATIONS - Patient complains of insomnia. Tech noted patient wanted to keep the bedroom quite warm. Inability to drop core body temperature during sleep is a common feature of insomnia, and may be exacerbated by an overly warm bedroom. - Avoid alcohol, sedatives and other CNS depressants that may worsen sleep apnea and disrupt normal sleep architecture. - Sleep hygiene should be reviewed to assess factors that may improve sleep quality. - Weight management and regular exercise should be initiated or continued if appropriate.  Deneise Lever Diplomate, American Board of Sleep Medicine  ELECTRONICALLY SIGNED ON:  02/14/2016, 3:00 PM [place] PH: [phone]   FX: [phone] [place]

## 2016-02-16 ENCOUNTER — Encounter: Payer: Self-pay | Admitting: Family Medicine

## 2016-02-16 DIAGNOSIS — G4761 Periodic limb movement disorder: Secondary | ICD-10-CM | POA: Insufficient documentation

## 2016-05-22 DIAGNOSIS — N390 Urinary tract infection, site not specified: Secondary | ICD-10-CM | POA: Diagnosis not present

## 2016-05-25 DIAGNOSIS — Z2821 Immunization not carried out because of patient refusal: Secondary | ICD-10-CM | POA: Diagnosis not present

## 2016-05-25 DIAGNOSIS — M549 Dorsalgia, unspecified: Secondary | ICD-10-CM | POA: Diagnosis not present

## 2016-05-25 DIAGNOSIS — F5101 Primary insomnia: Secondary | ICD-10-CM | POA: Diagnosis not present

## 2016-05-25 DIAGNOSIS — M150 Primary generalized (osteo)arthritis: Secondary | ICD-10-CM | POA: Diagnosis not present

## 2016-07-06 DIAGNOSIS — J302 Other seasonal allergic rhinitis: Secondary | ICD-10-CM | POA: Diagnosis not present

## 2016-09-01 DIAGNOSIS — M25562 Pain in left knee: Secondary | ICD-10-CM | POA: Diagnosis not present

## 2016-09-21 ENCOUNTER — Other Ambulatory Visit: Payer: Self-pay | Admitting: Family Medicine

## 2016-09-21 DIAGNOSIS — M7581 Other shoulder lesions, right shoulder: Secondary | ICD-10-CM | POA: Diagnosis not present

## 2016-09-21 DIAGNOSIS — Z1231 Encounter for screening mammogram for malignant neoplasm of breast: Secondary | ICD-10-CM

## 2016-09-21 DIAGNOSIS — Z803 Family history of malignant neoplasm of breast: Secondary | ICD-10-CM

## 2016-10-01 DIAGNOSIS — H2513 Age-related nuclear cataract, bilateral: Secondary | ICD-10-CM | POA: Diagnosis not present

## 2016-10-13 DIAGNOSIS — M1712 Unilateral primary osteoarthritis, left knee: Secondary | ICD-10-CM | POA: Diagnosis not present

## 2016-10-15 DIAGNOSIS — M25511 Pain in right shoulder: Secondary | ICD-10-CM | POA: Diagnosis not present

## 2016-10-28 DIAGNOSIS — M25511 Pain in right shoulder: Secondary | ICD-10-CM | POA: Diagnosis not present

## 2016-11-01 ENCOUNTER — Ambulatory Visit: Payer: Medicare Other

## 2016-11-15 ENCOUNTER — Ambulatory Visit
Admission: RE | Admit: 2016-11-15 | Discharge: 2016-11-15 | Disposition: A | Discharge status: Home / Self Care | Payer: Medicare Other | Source: Ambulatory Visit | Attending: Family Medicine | Admitting: Family Medicine

## 2016-11-15 ENCOUNTER — Other Ambulatory Visit: Payer: Self-pay | Admitting: Family Medicine

## 2016-11-15 DIAGNOSIS — Z803 Family history of malignant neoplasm of breast: Secondary | ICD-10-CM

## 2016-11-15 DIAGNOSIS — Z1231 Encounter for screening mammogram for malignant neoplasm of breast: Secondary | ICD-10-CM | POA: Diagnosis not present

## 2016-11-17 ENCOUNTER — Ambulatory Visit: Payer: Medicare Other

## 2017-01-06 DIAGNOSIS — R259 Unspecified abnormal involuntary movements: Secondary | ICD-10-CM | POA: Diagnosis not present

## 2017-01-06 DIAGNOSIS — G4761 Periodic limb movement disorder: Secondary | ICD-10-CM | POA: Diagnosis not present

## 2017-01-06 DIAGNOSIS — Z0000 Encounter for general adult medical examination without abnormal findings: Secondary | ICD-10-CM | POA: Diagnosis not present

## 2017-01-06 DIAGNOSIS — K219 Gastro-esophageal reflux disease without esophagitis: Secondary | ICD-10-CM | POA: Diagnosis not present

## 2017-01-06 DIAGNOSIS — F5101 Primary insomnia: Secondary | ICD-10-CM | POA: Diagnosis not present

## 2017-01-06 DIAGNOSIS — Z136 Encounter for screening for cardiovascular disorders: Secondary | ICD-10-CM | POA: Diagnosis not present

## 2017-01-06 DIAGNOSIS — M150 Primary generalized (osteo)arthritis: Secondary | ICD-10-CM | POA: Diagnosis not present

## 2017-07-12 ENCOUNTER — Encounter: Payer: Self-pay | Admitting: Family Medicine

## 2017-07-12 ENCOUNTER — Other Ambulatory Visit: Payer: Self-pay | Admitting: *Deleted

## 2017-07-12 ENCOUNTER — Ambulatory Visit (INDEPENDENT_AMBULATORY_CARE_PROVIDER_SITE_OTHER): Payer: Medicare Other | Admitting: Family Medicine

## 2017-07-12 ENCOUNTER — Encounter: Payer: Self-pay | Admitting: *Deleted

## 2017-07-12 VITALS — BP 140/80 | HR 80 | Temp 98.4°F | Ht 62.5 in | Wt 140.4 lb

## 2017-07-12 DIAGNOSIS — Z2821 Immunization not carried out because of patient refusal: Secondary | ICD-10-CM | POA: Insufficient documentation

## 2017-07-12 DIAGNOSIS — G4761 Periodic limb movement disorder: Secondary | ICD-10-CM

## 2017-07-12 DIAGNOSIS — Z1231 Encounter for screening mammogram for malignant neoplasm of breast: Secondary | ICD-10-CM

## 2017-07-12 DIAGNOSIS — F5102 Adjustment insomnia: Secondary | ICD-10-CM

## 2017-07-12 DIAGNOSIS — M8589 Other specified disorders of bone density and structure, multiple sites: Secondary | ICD-10-CM | POA: Diagnosis not present

## 2017-07-12 DIAGNOSIS — K219 Gastro-esophageal reflux disease without esophagitis: Secondary | ICD-10-CM | POA: Diagnosis not present

## 2017-07-12 DIAGNOSIS — Z1211 Encounter for screening for malignant neoplasm of colon: Secondary | ICD-10-CM | POA: Insufficient documentation

## 2017-07-12 MED ORDER — ROPINIROLE HCL 1 MG PO TABS: 0.5000 mg | ORAL_TABLET | Freq: Every day | ORAL | 5 refills | Status: DC

## 2017-07-12 NOTE — Progress Notes (Signed)
Subjective  CC:  Chief Complaint  Patient presents with  . Follow-up    6 month, transfer from [place]  . Insomnia    HPI: Diane Murphy is a 71 y.o. female who presents to [place] at [facility] today to establish care with me as a new patient. Former [facility] patient.  She has the following concerns or needs:   71 year old female here for follow-up of insomnia and well-controlled medical problems.  We have discussed her insomnia in the past.  To review, she has had a sleep study that did not show sleep apnea but did show periodic limb movement disorder.  She does admit to awakening herself with jerking of the legs or pain in the knee due to arthritis from moving her legs.  She denies symptoms of restless leg syndrome.  In the past she has had some anxiety related problems interfering with sleep including palpitations.  She is no longer having these.  However she does awaken worrying about things.  She is in the process of trying to get her house ready to sell so that she can downsize.  At times she feels overwhelmed maintaining her household.  She does have a good support system and denies symptoms of depression.  She has used Ambien in the past and this helps but does not keep her asleep long.  Her typical sleep pattern is to bed between 06/07/2010 and awakening between 3 and 4 usually thinking and worrying about things.  Melatonin does not help her.  She could not afford Belsomra.  Her GERD is doing fine.  She rarely uses medications.  Osteopenia on vitamin D and calcium supplementation.  Due for bone density.  She would like to schedule this with her next mammogram.  Orders have been placed.  She continues to refuse vaccinations.  Other health maintenance is up-to-date.  Annual wellness visit due in May along with Medicare lab visit.  We updated and reviewed the patient's past history in detail and it is documented below.  Patient Active Problem List   Diagnosis Date  Noted  . PLMD (periodic limb movement disorder) 02/16/2016    Priority: Medium  . Gastroesophageal reflux disease without esophagitis 09/09/2015    Priority: Medium  . Primary osteoarthritis involving multiple joints 09/09/2015    Priority: Medium  . Seasonal affective disorder ([place]) 09/09/2015    Priority: Medium  . Osteopenia 09/29/2014    Priority: Medium  . Insomnia due to stress 07/30/2014    Priority: Medium  . Colon cancer screening- cologuard neg 2017 07/12/2017  . No vaccination-pt refuse 07/12/2017    Health Maintenance  Topic Date Due  . Hepatitis C Screening  1946/02/13  . DEXA SCAN  09/26/2016  . MAMMOGRAM  11/15/2017  . Fecal DNA (Cologuard)  09/29/2018    There is no immunization history on file for this patient. Current Meds  Medication Sig  . BIOTIN PO Take 10,000 mcg by mouth daily.   . Calcium Carb-Cholecalciferol (CALCIUM-VITAMIN D3) 600-500 MG-UNIT CAPS Take by mouth.  . Cholecalciferol ([employer] VIT D 5000 HIGH-POTENCY PO) Take 1 tablet by mouth daily.   . Coenzyme Q-10 100 MG capsule Take 100 mg by mouth daily.   . Cranberry-Vit C-Lactobacillus (CRANBERRY/PROBIOTIC/VIT C PO) Take 2 tablets by mouth daily.   . Multiple Vitamin (MULTIVITAMIN) tablet Take 1 tablet by mouth.  . Omega-3 Fatty Acids (OMEGA-3 FISH OIL PO) Take 1,400 mg by mouth daily.   Allergies: Patient is allergic to ether.  Past  Medical History Patient  has a past medical history of Arthritis, Cystocele, grade 3 (01/2013), Depression, GERD (gastroesophageal reflux disease), and Osteopenia. Past Surgical History Patient  has a past surgical history that includes Tonsillectomy and adenoidectomy; Cholecystectomy (1999); right elbow (1970); and Cystocele repair (2014). Family History: Patient family history includes Alcoholism in her mother; Breast cancer in her sister; Depression in her mother; Diabetes in her father. Social History:  Patient  reports that  has never smoked. she has never used  smokeless tobacco. She reports that she does not drink alcohol or use drugs.  Review of Systems: Constitutional: negative for fever or malaise Cardiovascular: negative for chest pain Respiratory: negative for SOB or persistent cough Gastrointestinal: negative for abdominal pain Genitourinary: negative for dysuria or gross hematuria Musculoskeletal: negative for new gait disturbance or muscular weakness Integumentary: negative for new or persistent rashes  Patient Care Team    Relationship Specialty Notifications Start End  Leamon Arnt, MD PCP - General Family Medicine  07/12/17   Almedia Balls, MD Referring Physician Orthopedic Surgery  07/12/17     Objective  Vitals: BP 140/80 (BP Location: Left Arm, Patient Position: Sitting, Cuff Size: Normal)   Pulse 80   Temp 98.4 F (36.9 C) (Oral)   Ht 5' 2.5" (1.588 m)   Wt 140 lb 6.1 oz (63.7 kg)   SpO2 96%   BMI 25.27 kg/m  General:  Well developed, well nourished, no acute distress  Psych:  Alert and oriented,normal mood and affect HEENT:  Normocephalic, atraumatic, supple neck  Cardiovascular:  RRR without murmur, no edema Respiratory:  Good breath sounds bilaterally, CTAB with normal respiratory effort Gastrointestinal: normal bowel sounds, soft, non-tender, no noted masses. No HSM Skin:  Warm, no rashes Neurologic:   Mental status is normal. Gross motor and sensory exams are normal. Normal gait  Assessment  1. Insomnia due to stress   2. PLMD (periodic limb movement disorder)   3. Gastroesophageal reflux disease without esophagitis   4. Osteopenia of multiple sites   5. Screening mammogram, encounter for   6. No vaccination-pt refuse      Plan   Insomnia due to stress: Counseling done.  Symptoms could also be related to periodic limb movement disorder.  Recommend trial of Requip.  Educated on how this should help.  We will start with low dose 0.5 mg nightly and increase to 1 mg.  And follow-up in 6-8 weeks to see if  further titration is needed.  Hopefully once patient moves and get some help around the home she will have less stress which should also improve her sleep.  GERD is well controlled.  Schedule mammogram and follow-up bone density for April.  Follow up: Follow-up 6-8 weeks to recheck insomnia and medications.   Commons side effects, risks, benefits, and alternatives for medications and treatment plan prescribed today were discussed, and the patient expressed understanding of the given instructions. Patient is instructed to call or message via MyChart if he/she has any questions or concerns regarding our treatment plan. No barriers to understanding were identified. We discussed Red Flag symptoms and signs in detail. Patient expressed understanding regarding what to do in case of urgent or emergency type symptoms.   Medication list was reconciled, printed and provided to the patient in AVS. Patient instructions and summary information was reviewed with the patient as documented in the AVS. This note was prepared with assistance of Dragon voice recognition software. Occasional wrong-word or sound-a-like substitutions may have occurred due to  the inherent limitations of voice recognition software  Orders Placed This Encounter  Procedures  . MM DIGITAL SCREENING BILATERAL  . DG Bone Density   Meds ordered this encounter  Medications  . rOPINIRole (REQUIP) 1 MG tablet    Sig: Take 0.5-1 tablets (0.5-1 mg total) by mouth at bedtime.    Dispense:  30 tablet    Refill:  5

## 2017-07-12 NOTE — Patient Instructions (Addendum)
It was so good seeing you again! Thank you for establishing with my new practice and allowing me to continue caring for you. It means a lot to me.   Please schedule a follow up appointment with me in 6 -8 weeks to recheck your sleeping on the new medication.  Medicare recommends an Annual Wellness Visit for all patients. Please schedule this to be done with our Nurse Educator, Maudie Mercury in May 2019. This is an informative "talk" visit; it's goals are to ensure that your health care needs are being met and to give you education regarding avoiding falls, ensuring you are not suffering from depression or problems with memory or thinking, and to educate you on Advance Care Planning. It helps me take good care of you!

## 2017-07-23 ENCOUNTER — Other Ambulatory Visit: Payer: Self-pay | Admitting: Family Medicine

## 2017-07-23 DIAGNOSIS — Z1231 Encounter for screening mammogram for malignant neoplasm of breast: Secondary | ICD-10-CM

## 2017-08-12 ENCOUNTER — Encounter: Payer: Self-pay | Admitting: Family Medicine

## 2017-08-12 ENCOUNTER — Ambulatory Visit (INDEPENDENT_AMBULATORY_CARE_PROVIDER_SITE_OTHER): Payer: Medicare Other | Admitting: Family Medicine

## 2017-08-12 DIAGNOSIS — F5102 Adjustment insomnia: Secondary | ICD-10-CM | POA: Diagnosis not present

## 2017-08-12 DIAGNOSIS — L219 Seborrheic dermatitis, unspecified: Secondary | ICD-10-CM | POA: Diagnosis not present

## 2017-08-12 DIAGNOSIS — G4761 Periodic limb movement disorder: Secondary | ICD-10-CM

## 2017-08-12 MED ORDER — TRAZODONE HCL 50 MG PO TABS: 25.0000 mg | ORAL_TABLET | Freq: Every evening | ORAL | 3 refills | Status: DC | PRN

## 2017-08-12 MED ORDER — TRIAMCINOLONE ACETONIDE 0.1 % EX LOTN: 1.0000 "application " | TOPICAL_LOTION | Freq: Two times a day (BID) | CUTANEOUS | 2 refills | Status: DC | PRN

## 2017-08-12 NOTE — Patient Instructions (Signed)
Please follow up in May as scheduled.   Seborrheic Dermatitis, Adult Seborrheic dermatitis is a skin disease that causes red, scaly patches. It usually occurs on the scalp, and it is often called dandruff. The patches may appear on other parts of the body. Skin patches tend to appear where there are many oil glands in the skin. Areas of the body that are commonly affected include:  Scalp.  Skin folds of the body.  Ears.  Eyebrows.  Neck.  Face.  Armpits.  The bearded area of men's faces.  The condition may come and go for no known reason, and it is often long-lasting (chronic). What are the causes? The cause of this condition is not known. What increases the risk? This condition is more likely to develop in people who:  Have certain conditions, such as: ? HIV (human immunodeficiency virus). ? AIDS (acquired immunodeficiency syndrome). ? Parkinson disease. ? Mood disorders, such as depression.  Are 53-95 years old.  What are the signs or symptoms? Symptoms of this condition include:  Thick scales on the scalp.  Redness on the face or in the armpits.  Skin that is flaky. The flakes may be white or yellow.  Skin that seems oily or dry but is not helped with moisturizers.  Itching or burning in the affected areas.  How is this diagnosed? This condition is diagnosed with a medical history and physical exam. A sample of your skin may be tested (skin biopsy). You may need to see a skin specialist (dermatologist). How is this treated? There is no cure for this condition, but treatment can help to manage the symptoms. You may get treatment to remove scales, lower the risk of skin infection, and reduce swelling or itching. Treatment may include:  Creams that reduce swelling and irritation (steroids).  Creams that reduce skin yeast.  Medicated shampoo, soaps, moisturizing creams, or ointments.  Medicated moisturizing creams or ointments.  Follow these instructions at  home:  Apply over-the-counter and prescription medicines only as told by your health care provider.  Use any medicated shampoo, soaps, skin creams, or ointments only as told by your health care provider.  Keep all follow-up visits as told by your health care provider. This is important. Contact a health care provider if:  Your symptoms do not improve with treatment.  Your symptoms get worse.  You have new symptoms. This information is not intended to replace advice given to you by your health care provider. Make sure you discuss any questions you have with your health care provider. Document Released: 07/26/2005 Document Revised: 02/13/2016 Document Reviewed: 11/13/2015 Elsevier Interactive Patient Education  [employer].

## 2017-08-12 NOTE — Progress Notes (Signed)
Subjective  CC:  Chief Complaint  Patient presents with  . Rash    x 2 months, back of neck    HPI: Diane Murphy is a 72 y.o. female who presents to the office today to address the problems listed above in the chief complaint.  Patient complains of red itching flaking rash in the back of her neck and lower scalp.  Started 2 months ago.  She thought it may be related to a bite.  No associated symptoms.  Used some tea tree oil without significant relief of symptoms.  No other rashes noted.  Insomnia and periodic limb movement sleep disorder: She tried the Requip once and had an episode of nausea and vomiting.  Unable to tolerate it further.  Sleep is unchanged.  No new symptoms.  Please refer to last note for further details regarding typical sleep patterns and prior treatments.  I reviewed the patients updated PMH, FH, and SocHx.    Patient Active Problem List   Diagnosis Date Noted  . PLMD (periodic limb movement disorder) 02/16/2016    Priority: Medium  . Gastroesophageal reflux disease without esophagitis 09/09/2015    Priority: Medium  . Primary osteoarthritis involving multiple joints 09/09/2015    Priority: Medium  . Seasonal affective disorder ([place]) 09/09/2015    Priority: Medium  . Osteopenia 09/29/2014    Priority: Medium  . Insomnia due to stress 07/30/2014    Priority: Medium  . Colon cancer screening- cologuard neg 2017 07/12/2017  . No vaccination-pt refuse 07/12/2017   Current Meds  Medication Sig  . BIOTIN PO Take 10,000 mcg by mouth daily.   . Calcium Carb-Cholecalciferol (CALCIUM-VITAMIN D3) 600-500 MG-UNIT CAPS Take by mouth.  . Cholecalciferol ([employer]S VIT D 5000 HIGH-POTENCY PO) Take 1 tablet by mouth daily.   . Coenzyme Q-10 100 MG capsule Take 100 mg by mouth daily.   . Cranberry-Vit C-Lactobacillus (CRANBERRY/PROBIOTIC/VIT C PO) Take 2 tablets by mouth daily.   . Multiple Vitamin (MULTIVITAMIN) tablet Take 1 tablet by mouth.  . Omega-3 Fatty Acids  (OMEGA-3 FISH OIL PO) Take 1,400 mg by mouth daily.    Allergies: Patient is allergic to ether and requip [ropinirole hcl]. Family History: Patient family history includes Alcoholism in her mother; Breast cancer in her sister; Depression in her mother; Diabetes in her father. Social History:  Patient  reports that  has never smoked. she has never used smokeless tobacco. She reports that she does not drink alcohol or use drugs.  Review of Systems: Constitutional: Negative for fever malaise or anorexia Cardiovascular: negative for chest pain Respiratory: negative for SOB or persistent cough Gastrointestinal: negative for abdominal pain  Objective  Vitals: BP 130/80 (BP Location: Left Arm, Patient Position: Sitting, Cuff Size: Normal)   Pulse 73   Temp 98.3 F (36.8 C) (Oral)   Ht 5' 2.5" (1.588 m)   Wt 139 lb 4 oz (63.2 kg)   SpO2 97%   BMI 25.06 kg/m  General: no acute distress , A&Ox3 HEENT: PEERL, conjunctiva normal, Oropharynx moist,neck is supple Skin:  Warm, base of scalp with a red indurated erythematous and flaking rash.  No vesicles, urticaria, nontender.  No cervical or posterior lymphadenopathy.    Assessment  1. Seborrheic dermatitis of scalp   2. PLMD (periodic limb movement disorder)   3. Insomnia due to stress      Plan   Seborrheic dermatitis: Educated on etiology and treatment options.  Start triamcinolone lotion twice daily for 1-2 weeks then  as needed.  Reassured.  Discussed treatment for sleep options.  Trial of trazodone recommended.  Medication reported.  Appropriate use discussed.  To follow-up as needed for sleep.  Follow up: as needed.   Commons side effects, risks, benefits, and alternatives for medications and treatment plan prescribed today were discussed, and the patient expressed understanding of the given instructions. Patient is instructed to call or message via MyChart if he/she has any questions or concerns regarding our treatment plan. No  barriers to understanding were identified. We discussed Red Flag symptoms and signs in detail. Patient expressed understanding regarding what to do in case of urgent or emergency type symptoms.   Medication list was reconciled, printed and provided to the patient in AVS. Patient instructions and summary information was reviewed with the patient as documented in the AVS. This note was prepared with assistance of Dragon voice recognition software. Occasional wrong-word or sound-a-like substitutions may have occurred due to the inherent limitations of voice recognition software  No orders of the defined types were placed in this encounter.  Meds ordered this encounter  Medications  . triamcinolone lotion (KENALOG) 0.1 %    Sig: Apply 1 application topically 2 (two) times daily as needed.    Dispense:  60 mL    Refill:  2  . traZODone (DESYREL) 50 MG tablet    Sig: Take 0.5-1 tablets (25-50 mg total) by mouth at bedtime as needed for sleep.    Dispense:  30 tablet    Refill:  3

## 2017-08-29 ENCOUNTER — Ambulatory Visit: Payer: Medicare Other | Admitting: Family Medicine

## 2017-10-17 ENCOUNTER — Ambulatory Visit (INDEPENDENT_AMBULATORY_CARE_PROVIDER_SITE_OTHER): Payer: Medicare Other | Admitting: Family Medicine

## 2017-10-17 ENCOUNTER — Other Ambulatory Visit: Payer: Self-pay

## 2017-10-17 ENCOUNTER — Encounter: Payer: Self-pay | Admitting: Family Medicine

## 2017-10-17 VITALS — BP 104/60 | HR 66 | Temp 98.3°F | Ht 63.0 in | Wt 141.8 lb

## 2017-10-17 DIAGNOSIS — G4761 Periodic limb movement disorder: Secondary | ICD-10-CM

## 2017-10-17 DIAGNOSIS — F5102 Adjustment insomnia: Secondary | ICD-10-CM | POA: Diagnosis not present

## 2017-10-17 MED ORDER — TRAZODONE HCL 50 MG PO TABS: 150.0000 mg | ORAL_TABLET | Freq: Every evening | ORAL | 11 refills | Status: DC | PRN

## 2017-10-17 NOTE — Progress Notes (Signed)
Subjective  CC:  Chief Complaint  Patient presents with  . Follow-up    Patient is on trazodone, patient states she is taking 100mg , she states that sometimes works     HPI: Diane Murphy is a 72 y.o. female who presents to the office today to address the problems listed above in the chief complaint.  F/u insomnia: doing better on trazadone but had to increaes to 100mg  to get effect. Getting more rest; still awakens but mostly due to knee pain. Managing that with using a pillow between knees at night and will restart tumeric. No AEs. Not feeling groggy in the mornings. ? If she can go higher on the dose.   HM - has dexa and mammo scheduled for next month.  I reviewed the patients updated PMH, FH, and SocHx.    Patient Active Problem List   Diagnosis Date Noted  . PLMD (periodic limb movement disorder) 02/16/2016    Priority: Medium  . Gastroesophageal reflux disease without esophagitis 09/09/2015    Priority: Medium  . Primary osteoarthritis involving multiple joints 09/09/2015    Priority: Medium  . Seasonal affective disorder ([place]) 09/09/2015    Priority: Medium  . Osteopenia 09/29/2014    Priority: Medium  . Insomnia due to stress 07/30/2014    Priority: Medium  . Colon cancer screening- cologuard neg 2017 07/12/2017  . No vaccination-pt refuse 07/12/2017   Current Meds  Medication Sig  . BIOTIN PO Take 10,000 mcg by mouth daily.   . Calcium Carb-Cholecalciferol (CALCIUM-VITAMIN D3) 600-500 MG-UNIT CAPS Take by mouth.  . Cholecalciferol ([employer]S VIT D 5000 HIGH-POTENCY PO) Take 1 tablet by mouth daily.   . Coenzyme Q-10 100 MG capsule Take 100 mg by mouth daily.   . Cranberry-Vit C-Lactobacillus (CRANBERRY/PROBIOTIC/VIT C PO) Take 2 tablets by mouth daily.   . Multiple Vitamin (MULTIVITAMIN) tablet Take 1 tablet by mouth.  . Omega-3 Fatty Acids (OMEGA-3 FISH OIL PO) Take 1,400 mg by mouth daily.  . traZODone (DESYREL) 50 MG tablet Take 3 tablets (150 mg total) by mouth  at bedtime as needed for sleep.  . [DISCONTINUED] traZODone (DESYREL) 50 MG tablet Take 0.5-1 tablets (25-50 mg total) by mouth at bedtime as needed for sleep. (Patient taking differently: Take 100 mg by mouth at bedtime as needed for sleep. )    Allergies: Patient is allergic to ether and requip [ropinirole hcl]. Family History: Patient family history includes Alcoholism in her mother; Breast cancer in her sister; Depression in her mother; Diabetes in her father. Social History:  Patient  reports that  has never smoked. she has never used smokeless tobacco. She reports that she does not drink alcohol or use drugs.  Review of Systems: Constitutional: Negative for fever malaise or anorexia Cardiovascular: negative for chest pain Respiratory: negative for SOB or persistent cough Gastrointestinal: negative for abdominal pain  Objective  Vitals: BP 104/60   Pulse 66   Temp 98.3 F (36.8 C)   Ht 5\' 3"  (1.6 m)   Wt 141 lb 12.8 oz (64.3 kg)   BMI 25.12 kg/m  General: no acute distress , A&Ox3  Assessment  1. Insomnia due to stress   2. PLMD (periodic limb movement disorder)      Plan   insomnia:  Improved. Increase to 150mg  trazadone nightly. Counseling done on expectations of meds and sleep changes as we age.    Follow up: Return in about 3 months (around 01/17/2018) for complete physical, AWV at patient's convenience.  Commons side effects, risks, benefits, and alternatives for medications and treatment plan prescribed today were discussed, and the patient expressed understanding of the given instructions. Patient is instructed to call or message via MyChart if he/she has any questions or concerns regarding our treatment plan. No barriers to understanding were identified. We discussed Red Flag symptoms and signs in detail. Patient expressed understanding regarding what to do in case of urgent or emergency type symptoms.   Medication list was reconciled, printed and provided to  the patient in AVS. Patient instructions and summary information was reviewed with the patient as documented in the AVS. This note was prepared with assistance of Dragon voice recognition software. Occasional wrong-word or sound-a-like substitutions may have occurred due to the inherent limitations of voice recognition software  No orders of the defined types were placed in this encounter.  Meds ordered this encounter  Medications  . traZODone (DESYREL) 50 MG tablet    Sig: Take 3 tablets (150 mg total) by mouth at bedtime as needed for sleep.    Dispense:  90 tablet    Refill:  11

## 2017-10-17 NOTE — Patient Instructions (Signed)
Please return in 2-3 months for your medicare CPE (30 minute visit) with lab work.   If you have any questions or concerns, please don't hesitate to send me a message via MyChart or call the office at [phone]. Thank you for visiting with [place] today! It's our pleasure caring for you.  Medicare recommends an Annual Wellness Visit for all patients. Please schedule this to be done with our Nurse Educator, Maudie Mercury. This is an informative "talk" visit; it's goals are to ensure that your health care needs are being met and to give you education regarding avoiding falls, ensuring you are not suffering from depression or problems with memory or thinking, and to educate you on Advance Care Planning. It helps me take good care of you!

## 2017-11-08 DIAGNOSIS — M25561 Pain in right knee: Secondary | ICD-10-CM | POA: Diagnosis not present

## 2017-11-08 DIAGNOSIS — M1711 Unilateral primary osteoarthritis, right knee: Secondary | ICD-10-CM | POA: Diagnosis not present

## 2017-11-16 ENCOUNTER — Ambulatory Visit
Admission: RE | Admit: 2017-11-16 | Discharge: 2017-11-16 | Disposition: A | Discharge status: Home / Self Care | Payer: Medicare Other | Source: Ambulatory Visit | Attending: Family Medicine | Admitting: Family Medicine

## 2017-11-16 DIAGNOSIS — M85851 Other specified disorders of bone density and structure, right thigh: Secondary | ICD-10-CM | POA: Diagnosis not present

## 2017-11-16 DIAGNOSIS — M8589 Other specified disorders of bone density and structure, multiple sites: Secondary | ICD-10-CM

## 2017-11-16 DIAGNOSIS — Z1231 Encounter for screening mammogram for malignant neoplasm of breast: Secondary | ICD-10-CM | POA: Diagnosis not present

## 2017-11-16 DIAGNOSIS — Z780 Asymptomatic menopausal state: Secondary | ICD-10-CM | POA: Diagnosis not present

## 2017-11-21 NOTE — Progress Notes (Signed)
Please call patient: I have reviewed his/her lab results. I have reviewed her normal mammogram and her bone density results . She does have osteopenia - at this point, continue weight bearing exercise, calcium and vit D supplements and we will recheck this in 1-2 years. At that point, we will consider starting medications to prevent further thinning of the bones.
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
## 2017-12-21 ENCOUNTER — Ambulatory Visit (INDEPENDENT_AMBULATORY_CARE_PROVIDER_SITE_OTHER): Payer: Medicare Other | Admitting: Family Medicine

## 2017-12-21 ENCOUNTER — Other Ambulatory Visit: Payer: Self-pay

## 2017-12-21 ENCOUNTER — Ambulatory Visit: Payer: Medicare Other

## 2017-12-21 ENCOUNTER — Encounter: Payer: Self-pay | Admitting: Family Medicine

## 2017-12-21 VITALS — BP 130/78 | HR 75 | Temp 98.2°F | Ht 63.0 in | Wt 141.6 lb

## 2017-12-21 DIAGNOSIS — Z1159 Encounter for screening for other viral diseases: Secondary | ICD-10-CM | POA: Diagnosis not present

## 2017-12-21 DIAGNOSIS — M8589 Other specified disorders of bone density and structure, multiple sites: Secondary | ICD-10-CM | POA: Diagnosis not present

## 2017-12-21 DIAGNOSIS — F5102 Adjustment insomnia: Secondary | ICD-10-CM | POA: Diagnosis not present

## 2017-12-21 DIAGNOSIS — Z2821 Immunization not carried out because of patient refusal: Secondary | ICD-10-CM | POA: Diagnosis not present

## 2017-12-21 DIAGNOSIS — K219 Gastro-esophageal reflux disease without esophagitis: Secondary | ICD-10-CM

## 2017-12-21 DIAGNOSIS — F338 Other recurrent depressive disorders: Secondary | ICD-10-CM | POA: Diagnosis not present

## 2017-12-21 DIAGNOSIS — G4761 Periodic limb movement disorder: Secondary | ICD-10-CM

## 2017-12-21 DIAGNOSIS — R7989 Other specified abnormal findings of blood chemistry: Secondary | ICD-10-CM | POA: Diagnosis not present

## 2017-12-21 LAB — CBC WITH DIFFERENTIAL/PLATELET
Basophils Absolute: 0.1 10*3/uL (ref 0.0–0.1)
Basophils Relative: 1 % (ref 0.0–3.0)
Eosinophils Absolute: 0.1 10*3/uL (ref 0.0–0.7)
Eosinophils Relative: 1.8 % (ref 0.0–5.0)
HCT: 46.4 % — ABNORMAL HIGH (ref 36.0–46.0)
Hemoglobin: 15.8 g/dL — ABNORMAL HIGH (ref 12.0–15.0)
Lymphocytes Relative: 24.7 % (ref 12.0–46.0)
Lymphs Abs: 1.8 10*3/uL (ref 0.7–4.0)
MCHC: 34.1 g/dL (ref 30.0–36.0)
MCV: 89.6 fl (ref 78.0–100.0)
Monocytes Absolute: 0.5 10*3/uL (ref 0.1–1.0)
Monocytes Relative: 6.3 % (ref 3.0–12.0)
Neutro Abs: 4.9 10*3/uL (ref 1.4–7.7)
Neutrophils Relative %: 66.2 % (ref 43.0–77.0)
Platelets: 202 10*3/uL (ref 150.0–400.0)
RBC: 5.17 Mil/uL — ABNORMAL HIGH (ref 3.87–5.11)
RDW: 12.6 % (ref 11.5–15.5)
WBC: 7.5 10*3/uL (ref 4.0–10.5)

## 2017-12-21 LAB — COMPREHENSIVE METABOLIC PANEL
ALT: 13 U/L (ref 0–35)
AST: 20 U/L (ref 0–37)
Albumin: 4.4 g/dL (ref 3.5–5.2)
Alkaline Phosphatase: 74 U/L (ref 39–117)
BUN: 17 mg/dL (ref 6–23)
CO2: 31 mEq/L (ref 19–32)
Calcium: 10 mg/dL (ref 8.4–10.5)
Chloride: 100 mEq/L (ref 96–112)
Creatinine, Ser: 0.69 mg/dL (ref 0.40–1.20)
GFR: 88.55 mL/min (ref >60.00)
Glucose, Bld: 84 mg/dL (ref 70–99)
Potassium: 4 mEq/L (ref 3.5–5.1)
Sodium: 138 mEq/L (ref 135–145)
Total Bilirubin: 1.1 mg/dL (ref 0.2–1.2)
Total Protein: 7.1 g/dL (ref 6.0–8.3)

## 2017-12-21 MED ORDER — ZOLPIDEM TARTRATE 5 MG PO TABS
5.0000 mg | ORAL_TABLET | Freq: Every evening | ORAL | 0 refills | Status: DC | PRN
Start: 2017-12-21 — End: 2018-02-16

## 2017-12-21 NOTE — Patient Instructions (Addendum)
Please return for Annual Wellness visit as scheduled.  Please schedule an ov with me in 12 months for your annual physical.  You may try [place] for your skin.  You may try SELSUN BLUE SHAMPOO for your scalp.  Your may try ZYRTEC 10MG NIGHTLY to help with itching and sleep.   We will call you with your results and send you a copy in the mail.   Medicare recommends an Annual Wellness Visit for all patients. Please schedule this to be done with our Nurse Educator, Maudie Mercury. This is an informative "talk" visit; it's goals are to ensure that your health care needs are being met and to give you education regarding avoiding falls, ensuring you are not suffering from depression or problems with memory or thinking, and to educate you on Advance Care Planning. It helps me take good care of you!  If you have any questions or concerns, please don't hesitate to send me a message via MyChart or call the office at [phone]. Thank you for visiting with [place] today! It's our pleasure caring for you.  Please do these things to maintain good health!   Exercise at least 30-45 minutes a day,  4-5 days a week.   Eat a low-fat diet with lots of fruits and vegetables, up to 7-9 servings per day.  Drink plenty of water daily. Try to drink 8 8oz glasses per day.  Seatbelts can save your life. Always wear your seatbelt.  Place Smoke Detectors on every level of your home and check batteries every year.  Schedule an appointment with an eye doctor for an eye exam every 1-2 years  Safe sex - use condoms to protect yourself from STDs if you could be exposed to these types of infections. Use birth control if you do not want to become pregnant and are sexually active.  Avoid heavy alcohol use. If you drink, keep it to less than 2 drinks/day and not every day.  [place].  Choose someone you trust that could speak for you if you became unable to speak for  yourself.  Depression is common in our stressful world.If you're feeling down or losing interest in things you normally enjoy, please come in for a visit.  If anyone is threatening or hurting you, please get help. Physical or Emotional Violence is never OK.  Please read the patient education material about Living Eugenie Birks and [place].    Both the Living Will and [place] require a Insurance account manager to [employer] on either document.  You may complete just one or both or neither of the documents. It is voluntaary. However, it can be very helpful if needed and I strongly encourage you to complete both forms stating your Advanced Directives. This will help me and your other healthcare providers care for you.  If you choose to complete either or both documents, please bring in or mail copies of the completed documents to my office so that I may put them in your medical chart. This way will ensure we have them if needed.

## 2017-12-21 NOTE — Progress Notes (Signed)
Subjective  Chief Complaint  Patient presents with  . Annual Exam    doing well  . Hair/Scalp Problem    patient states that her scalp is itchy off and on for a year or so     HPI: Diane Murphy is a 72 y.o. female who presents to [place] at [facility] today for a Female Wellness Visit.   Wellness Visit: medicare annual visit with health maintenance review and exam without Pap   Overall doing very well. Just put home on market; planning on moving to [facility] to be closer to family.   Osteopenia with elevated FRAX score of 2.9%: on calcium and vit D: hesitant to start antiresorbtives now.   Insomnia and PLMD persist. trazadone 150 helped but felt groggy and had a stuffy nose with it so stopped it. Has used ambien only intermittently in past and thinks that is her best strategy. Can't afford belsomra. Doesn't want to try a tricyclic due to possible side effects.   C/o itching scalp and dry skin w/o rashes.  Lifestyle: Body mass index is 25.08 kg/m. Wt Readings from Last 3 Encounters:  12/21/17 141 lb 9.6 oz (64.2 kg)  10/17/17 141 lb 12.8 oz (64.3 kg)  08/12/17 139 lb 4 oz (63.2 kg)   Diet: general Exercise: intermittently,   Patient Active Problem List   Diagnosis Date Noted  . PLMD (periodic limb movement disorder) 02/16/2016    Priority: Medium    No OSA but does have PLMD on sleep study from 02/2016   . Gastroesophageal reflux disease without esophagitis 09/09/2015    Priority: Medium  . Primary osteoarthritis involving multiple joints 09/09/2015    Priority: Medium  . Seasonal affective disorder ([place]) 09/09/2015    Priority: Medium  . Osteopenia 09/29/2014    Priority: Medium    Dexa 11/2017 - T = -1.6 lowest. FRAX score 2.9%; consider therapy vs rechecking in 1 year.    . Insomnia due to stress 07/30/2014    Priority: Medium  . Colon cancer screening- cologuard neg 2017 07/12/2017  . No vaccination-pt refuse 07/12/2017   Health  Maintenance  Topic Date Due  . Hepatitis C Screening  12/26/1945  . Fecal DNA (Cologuard)  09/29/2018  . MAMMOGRAM  11/17/2018  . DEXA SCAN  11/17/2019    There is no immunization history on file for this patient. We updated and reviewed the patient's past history in detail and it is documented below. Allergies: Patient is allergic to ether and requip [ropinirole hcl]. Past Medical History Patient  has a past medical history of Arthritis, Cystocele, grade 3 (01/2013), Depression, GERD (gastroesophageal reflux disease), and Osteopenia. Past Surgical History Patient  has a past surgical history that includes Tonsillectomy and adenoidectomy; Cholecystectomy (1999); right elbow (1970); and Cystocele repair (2014). Family History: Patient family history includes Alcoholism in her mother; Breast cancer (age of onset: 26) in her sister; Depression in her mother; Diabetes in her father. Social History:  Patient  reports that she has never smoked. She has never used smokeless tobacco. She reports that she does not drink alcohol or use drugs.  Review of Systems: Constitutional: negative for fever or malaise Ophthalmic: negative for photophobia, double vision or loss of vision Cardiovascular: negative for chest pain, dyspnea on exertion, or new LE swelling Respiratory: negative for SOB or persistent cough Gastrointestinal: negative for abdominal pain, change in bowel habits or melena Genitourinary: negative for dysuria or gross hematuria, no abnormal uterine bleeding or disharge Musculoskeletal: negative  for new gait disturbance or muscular weakness Integumentary: negative for new or persistent rashes, no breast lumps Neurological: negative for TIA or stroke symptoms Psychiatric: negative for SI or delusions Allergic/Immunologic: negative for hives  Patient Care Team    Relationship Specialty Notifications Start End  Leamon Arnt, MD PCP - General Family Medicine  07/12/17   Almedia Balls,  MD Referring Physician Orthopedic Surgery  07/12/17     Objective  Vitals: BP 130/78   Pulse 75   Temp 98.2 F (36.8 C)   Ht 5\' 3"  (1.6 m)   Wt 141 lb 9.6 oz (64.2 kg)   BMI 25.08 kg/m  General:  Well developed, well nourished, no acute distress  Psych:  Alert and orientedx3,normal mood and affect HEENT:  Normocephalic, atraumatic, non-icteric sclera, PERRL, oropharynx is clear without mass or exudate, supple neck without adenopathy, mass or thyromegaly Cardiovascular:  Normal S1, S2, RRR without gallop, rub or murmur, nondisplaced PMI Respiratory:  Good breath sounds bilaterally, CTAB with normal respiratory effort Gastrointestinal: normal bowel sounds, soft, non-tender, no noted masses. No HSM MSK: no deformities, contusions. Joints are without erythema or swelling. Spine and CVA region are nontender Skin:  Warm, no rashes or suspicious lesions noted, scalp is mildly diffusely red; colors hair Neurologic:    Mental status is normal. CN 2-11 are normal. Gross motor and sensory exams are normal. Normal gait. No tremor Breast Exam: No mass, skin retraction or nipple discharge is appreciated in either breast. No axillary adenopathy. Fibrocystic changes are not noted   Assessment  1. Gastroesophageal reflux disease without esophagitis   2. Osteopenia of multiple sites   3. PLMD (periodic limb movement disorder)   4. Insomnia due to stress   5. Need for hepatitis C screening test   6. No vaccination-pt refuse      Plan  Female Wellness Visit:  Age appropriate Health Maintenance and Prevention measures were discussed with patient. Included topics are cancer screening recommendations, ways to keep healthy (see AVS) including dietary and exercise recommendations, regular eye and dental care, use of seat belts, and avoidance of moderate alcohol use and tobacco use.   BMI: discussed patient's BMI and encouraged positive lifestyle modifications to help get to or maintain a target  BMI.  HM needs and immunizations were addressed and ordered. See below for orders. See HM and immunization section for updates.  Routine labs and screening tests ordered including cmp, cbc and lipids where appropriate.  Discussed recommendations regarding Vit D and calcium supplementation (see AVS)  Discussed osteopenia with incrased 10 yr FRAX score: will recheck dexa in one year and then again discuss medication if indicated. Discussed increasing weight bearing exercises.   Check for iron deficiency given PLMD.  GERD is stable. Monitor labs.   Dry skin: cetaphil, eucerin or lubriderm. Decrease hot showers/baths; selsun blue for scalp; zyrtec nightly for sleep and itching.  Insomnia: ambien #30 for prn use. Understands risks.  Follow up: has f/u forAWV; return with me in 6 months for f/u on sleep.   Commons side effects, risks, benefits, and alternatives for medications and treatment plan prescribed today were discussed, and the patient expressed understanding of the given instructions. Patient is instructed to call or message via MyChart if he/she has any questions or concerns regarding our treatment plan. No barriers to understanding were identified. We discussed Red Flag symptoms and signs in detail. Patient expressed understanding regarding what to do in case of urgent or emergency type symptoms.   Medication  list was reconciled, printed and provided to the patient in AVS. Patient instructions and summary information was reviewed with the patient as documented in the AVS. This note was prepared with assistance of Dragon voice recognition software. Occasional wrong-word or sound-a-like substitutions may have occurred due to the inherent limitations of voice recognition software  Orders Placed This Encounter  Procedures  . Hepatitis C antibody  . Comprehensive metabolic panel  . CBC with Differential/Platelet  . Iron, TIBC and Ferritin Panel   No orders of the defined types were  placed in this encounter.

## 2017-12-22 LAB — HEPATITIS C ANTIBODY
Hepatitis C Ab: NONREACTIVE
SIGNAL TO CUT-OFF: 0.02 (ref <1.00)

## 2017-12-22 LAB — IRON,TIBC AND FERRITIN PANEL
%SAT: 31 % (calc) (ref 11–50)
Ferritin: 97 ng/mL (ref 20–288)
Iron: 98 ug/dL (ref 45–160)
TIBC: 318 mcg/dL (calc) (ref 250–450)

## 2017-12-23 NOTE — Progress Notes (Signed)
Please call patient: I have reviewed his/her lab results. All are normal. Everything looks good!
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
## 2018-01-18 ENCOUNTER — Other Ambulatory Visit: Payer: Self-pay

## 2018-01-18 ENCOUNTER — Telehealth: Payer: Self-pay

## 2018-01-18 ENCOUNTER — Ambulatory Visit (INDEPENDENT_AMBULATORY_CARE_PROVIDER_SITE_OTHER): Payer: Medicare Other

## 2018-01-18 VITALS — BP 128/68 | HR 68 | Ht 63.0 in | Wt 139.4 lb

## 2018-01-18 DIAGNOSIS — Z0000 Encounter for general adult medical examination without abnormal findings: Secondary | ICD-10-CM | POA: Diagnosis not present

## 2018-01-18 NOTE — Patient Instructions (Addendum)

## 2018-01-18 NOTE — Telephone Encounter (Signed)
Pt in for AWV today, requesting recommendation for ENT specialist. Pt was told in the past "ear canals are narrow" and would benefit from tubes. Please advise.

## 2018-01-18 NOTE — Progress Notes (Signed)
RN [place] note reviewed in PCP absence.   Recommend she talk to Dr. Jonni Sanger about sleep quality at next follow-up. Unfortunately our sleep patterns worsen as we get older but there are some sleep hygiene practices that may be helpful to her.   Leeanne Rio, PA-C

## 2018-01-18 NOTE — Progress Notes (Signed)
I have reviewed the documentation from the recent AWV done by Kim Broome; I agree with the documentation and will follow up on any recommendations or abnormal findings as suggested.  

## 2018-01-19 NOTE — Telephone Encounter (Signed)
Please call patient.  No need for ENT referral for narrow canals.  Office visit recommended if she is having ear problems.

## 2018-01-19 NOTE — Telephone Encounter (Signed)
Patient states that her hearing isn't as good as it used to be. Advised patient we could evaluate if she was having a problem. Patient states she will call back to make an appt.   Doloris Hall,  LPN

## 2018-02-15 ENCOUNTER — Other Ambulatory Visit: Payer: Self-pay | Admitting: *Deleted

## 2018-02-15 ENCOUNTER — Telehealth: Payer: Self-pay | Admitting: Family Medicine

## 2018-02-15 NOTE — Telephone Encounter (Signed)
Copied from [place] [id number]. Topic: Quick Communication - Rx Refill/Question >> Feb 15, 2018  9:07 AM Gardiner Ramus wrote: Medication:zoltidem tartrate 10mg . Pt states Ambien is to expensive   Preferred Pharmacy (with phone number or street name):[id number] [place], [place]. AT [address] [phone] (Phone) [phone] (Fax)   Agent: Please be advised that RX refills may take up to 3 business days. We ask that you follow-up with your pharmacy.

## 2018-02-15 NOTE — Telephone Encounter (Signed)
Pt due for refill of Ambien; requesting generic zoltidem tartrate  LRF 12/21/17  #30 0 refills  LOV 12/21/17   Dr. Jonni Sanger  [id number] [place], [place] - [address]. AT [address]          [phone] (Phone) [phone] (Fax)

## 2018-02-16 MED ORDER — ZOLPIDEM TARTRATE 5 MG PO TABS: 5.0000 mg | ORAL_TABLET | Freq: Every evening | ORAL | 5 refills | Status: DC | PRN

## 2018-02-16 NOTE — Telephone Encounter (Signed)
Refilled ambien x 6 months.

## 2018-02-21 ENCOUNTER — Other Ambulatory Visit: Payer: Self-pay | Admitting: Family Medicine

## 2018-02-21 MED ORDER — ZOLPIDEM TARTRATE 10 MG PO TABS: 5.0000 mg | ORAL_TABLET | Freq: Every evening | ORAL | 5 refills | Status: DC | PRN

## 2018-02-21 NOTE — Telephone Encounter (Signed)
Copied from [place] [id number]. Topic: Quick Communication - See Telephone Encounter >> Feb 21, 2018 10:48 AM Percell Belt A wrote: CRM for notification. See Telephone encounter for: 02/21/18.  Pt called in and stated that they will not fill the zolpidem (AMBIEN) 5 MG tablet [[id number]] because they think she is on the Trazodone too.  She has not been taking this med.  It was switched.  She would like someone to contact pharmacy and straighten out the meds   [id number] [place], [place]. AT [address] [phone] (Phone)

## 2018-02-21 NOTE — Telephone Encounter (Signed)
Needs Prior Authorization, Pharmacist suggested to send in 10 mg and patient to take 1/2 tablet.   Patient notified

## 2018-02-21 NOTE — Addendum Note (Signed)
Addended bySigurd Sos on: 02/21/2018 11:10 AM   Modules accepted: Orders

## 2018-02-24 ENCOUNTER — Telehealth: Payer: Self-pay | Admitting: Emergency Medicine

## 2018-02-24 NOTE — Telephone Encounter (Signed)
Completed Prior Authorization, Danise Mina states they will send approval  Via fax number, after appeals department comes to a decision. Patient is aware.   Doloris Hall,  LPN

## 2018-02-24 NOTE — Telephone Encounter (Signed)
Copied from [place] [id number]. Topic: Inquiry >> Feb 24, 2018  9:44 AM Pricilla Handler wrote: Reason for CRM: Patient called stating that she needs for Dr. Jonni Sanger to call [place] [facility] at [phone]. Patient states that she will be charged $20.00 for her zolpidem (AMBIEN) 10 MG tablet, unless Dr. Jonni Sanger calls. Please call patient for further details.        Thank You!!!

## 2018-02-24 NOTE — Telephone Encounter (Signed)
PA Approved.   Approval Code: [id number]  Expires: 08/08/2018

## 2018-03-31 DIAGNOSIS — M1712 Unilateral primary osteoarthritis, left knee: Secondary | ICD-10-CM | POA: Diagnosis not present

## 2018-03-31 DIAGNOSIS — M25512 Pain in left shoulder: Secondary | ICD-10-CM | POA: Diagnosis not present

## 2018-04-12 DIAGNOSIS — M25512 Pain in left shoulder: Secondary | ICD-10-CM | POA: Diagnosis not present

## 2018-04-12 DIAGNOSIS — M1712 Unilateral primary osteoarthritis, left knee: Secondary | ICD-10-CM | POA: Diagnosis not present

## 2018-05-31 DIAGNOSIS — M5136 Other intervertebral disc degeneration, lumbar region: Secondary | ICD-10-CM | POA: Diagnosis not present

## 2018-05-31 DIAGNOSIS — R918 Other nonspecific abnormal finding of lung field: Secondary | ICD-10-CM | POA: Diagnosis not present

## 2018-05-31 DIAGNOSIS — M4854XA Collapsed vertebra, not elsewhere classified, thoracic region, initial encounter for fracture: Secondary | ICD-10-CM | POA: Diagnosis not present

## 2018-05-31 DIAGNOSIS — J9811 Atelectasis: Secondary | ICD-10-CM | POA: Diagnosis not present

## 2018-05-31 DIAGNOSIS — M4856XA Collapsed vertebra, not elsewhere classified, lumbar region, initial encounter for fracture: Secondary | ICD-10-CM | POA: Diagnosis not present

## 2018-05-31 DIAGNOSIS — Z79899 Other long term (current) drug therapy: Secondary | ICD-10-CM | POA: Diagnosis not present

## 2018-05-31 DIAGNOSIS — M47816 Spondylosis without myelopathy or radiculopathy, lumbar region: Secondary | ICD-10-CM | POA: Diagnosis not present

## 2018-05-31 DIAGNOSIS — M419 Scoliosis, unspecified: Secondary | ICD-10-CM | POA: Diagnosis not present

## 2018-05-31 DIAGNOSIS — M545 Low back pain: Secondary | ICD-10-CM | POA: Diagnosis not present

## 2018-11-03 DIAGNOSIS — M1731 Unilateral post-traumatic osteoarthritis, right knee: Secondary | ICD-10-CM | POA: Diagnosis not present

## 2018-11-03 DIAGNOSIS — Z1212 Encounter for screening for malignant neoplasm of rectum: Secondary | ICD-10-CM | POA: Diagnosis not present

## 2018-11-03 DIAGNOSIS — R739 Hyperglycemia, unspecified: Secondary | ICD-10-CM | POA: Diagnosis not present

## 2018-11-03 DIAGNOSIS — Z23 Encounter for immunization: Secondary | ICD-10-CM | POA: Diagnosis not present

## 2018-11-03 DIAGNOSIS — H547 Unspecified visual loss: Secondary | ICD-10-CM | POA: Diagnosis not present

## 2018-11-03 DIAGNOSIS — M179 Osteoarthritis of knee, unspecified: Secondary | ICD-10-CM | POA: Insufficient documentation

## 2018-11-03 DIAGNOSIS — M4125 Other idiopathic scoliosis, thoracolumbar region: Secondary | ICD-10-CM | POA: Insufficient documentation

## 2018-11-03 DIAGNOSIS — Z1231 Encounter for screening mammogram for malignant neoplasm of breast: Secondary | ICD-10-CM | POA: Diagnosis not present

## 2018-11-03 DIAGNOSIS — F5101 Primary insomnia: Secondary | ICD-10-CM | POA: Diagnosis not present

## 2018-11-03 DIAGNOSIS — L659 Nonscarring hair loss, unspecified: Secondary | ICD-10-CM | POA: Diagnosis not present

## 2018-11-03 DIAGNOSIS — Z1239 Encounter for other screening for malignant neoplasm of breast: Secondary | ICD-10-CM | POA: Diagnosis not present

## 2018-11-03 DIAGNOSIS — Z1211 Encounter for screening for malignant neoplasm of colon: Secondary | ICD-10-CM | POA: Diagnosis not present

## 2018-11-03 DIAGNOSIS — M1710 Unilateral primary osteoarthritis, unspecified knee: Secondary | ICD-10-CM | POA: Insufficient documentation

## 2018-11-03 DIAGNOSIS — T7840XA Allergy, unspecified, initial encounter: Secondary | ICD-10-CM | POA: Diagnosis not present

## 2018-11-04 DIAGNOSIS — S92501A Displaced unspecified fracture of right lesser toe(s), initial encounter for closed fracture: Secondary | ICD-10-CM | POA: Diagnosis not present

## 2018-11-04 DIAGNOSIS — S92511A Displaced fracture of proximal phalanx of right lesser toe(s), initial encounter for closed fracture: Secondary | ICD-10-CM | POA: Diagnosis not present

## 2018-11-04 DIAGNOSIS — Y33XXXA Other specified events, undetermined intent, initial encounter: Secondary | ICD-10-CM | POA: Diagnosis not present

## 2018-11-06 DIAGNOSIS — Z1211 Encounter for screening for malignant neoplasm of colon: Secondary | ICD-10-CM | POA: Diagnosis not present

## 2018-11-06 DIAGNOSIS — G2581 Restless legs syndrome: Secondary | ICD-10-CM | POA: Insufficient documentation

## 2018-11-06 DIAGNOSIS — Z1212 Encounter for screening for malignant neoplasm of rectum: Secondary | ICD-10-CM | POA: Diagnosis not present

## 2018-11-06 DIAGNOSIS — N8110 Cystocele, unspecified: Secondary | ICD-10-CM | POA: Insufficient documentation

## 2018-11-08 DIAGNOSIS — S92504A Nondisplaced unspecified fracture of right lesser toe(s), initial encounter for closed fracture: Secondary | ICD-10-CM | POA: Diagnosis not present

## 2018-11-23 DIAGNOSIS — J302 Other seasonal allergic rhinitis: Secondary | ICD-10-CM | POA: Diagnosis not present

## 2018-11-23 DIAGNOSIS — G4700 Insomnia, unspecified: Secondary | ICD-10-CM | POA: Diagnosis not present

## 2018-11-23 DIAGNOSIS — M13869 Other specified arthritis, unspecified knee: Secondary | ICD-10-CM | POA: Diagnosis not present

## 2018-11-23 DIAGNOSIS — M810 Age-related osteoporosis without current pathological fracture: Secondary | ICD-10-CM | POA: Diagnosis not present

## 2018-11-28 DIAGNOSIS — S92504D Nondisplaced unspecified fracture of right lesser toe(s), subsequent encounter for fracture with routine healing: Secondary | ICD-10-CM | POA: Diagnosis not present

## 2018-12-07 DIAGNOSIS — G4700 Insomnia, unspecified: Secondary | ICD-10-CM | POA: Diagnosis not present

## 2018-12-07 DIAGNOSIS — M810 Age-related osteoporosis without current pathological fracture: Secondary | ICD-10-CM | POA: Diagnosis not present

## 2018-12-07 DIAGNOSIS — M79606 Pain in leg, unspecified: Secondary | ICD-10-CM | POA: Diagnosis not present

## 2018-12-07 DIAGNOSIS — L853 Xerosis cutis: Secondary | ICD-10-CM | POA: Diagnosis not present

## 2018-12-07 DIAGNOSIS — M13869 Other specified arthritis, unspecified knee: Secondary | ICD-10-CM | POA: Diagnosis not present

## 2018-12-07 DIAGNOSIS — J302 Other seasonal allergic rhinitis: Secondary | ICD-10-CM | POA: Diagnosis not present

## 2019-01-05 DIAGNOSIS — E349 Endocrine disorder, unspecified: Secondary | ICD-10-CM | POA: Diagnosis not present

## 2019-01-05 DIAGNOSIS — L853 Xerosis cutis: Secondary | ICD-10-CM | POA: Diagnosis not present

## 2019-01-05 DIAGNOSIS — G4700 Insomnia, unspecified: Secondary | ICD-10-CM | POA: Diagnosis not present

## 2019-01-05 DIAGNOSIS — M810 Age-related osteoporosis without current pathological fracture: Secondary | ICD-10-CM | POA: Diagnosis not present

## 2019-01-05 DIAGNOSIS — Z79890 Hormone replacement therapy (postmenopausal): Secondary | ICD-10-CM | POA: Diagnosis not present

## 2019-01-05 DIAGNOSIS — R195 Other fecal abnormalities: Secondary | ICD-10-CM | POA: Diagnosis not present

## 2019-01-05 DIAGNOSIS — M79606 Pain in leg, unspecified: Secondary | ICD-10-CM | POA: Diagnosis not present

## 2019-01-24 DIAGNOSIS — I83893 Varicose veins of bilateral lower extremities with other complications: Secondary | ICD-10-CM | POA: Diagnosis not present

## 2019-01-24 DIAGNOSIS — Z1159 Encounter for screening for other viral diseases: Secondary | ICD-10-CM | POA: Diagnosis not present

## 2019-01-25 ENCOUNTER — Ambulatory Visit: Payer: Medicare Other | Admitting: Family Medicine

## 2019-01-25 ENCOUNTER — Ambulatory Visit: Payer: Medicare Other

## 2019-01-31 DIAGNOSIS — L853 Xerosis cutis: Secondary | ICD-10-CM | POA: Diagnosis not present

## 2019-01-31 DIAGNOSIS — G4700 Insomnia, unspecified: Secondary | ICD-10-CM | POA: Diagnosis not present

## 2019-01-31 DIAGNOSIS — M79606 Pain in leg, unspecified: Secondary | ICD-10-CM | POA: Diagnosis not present

## 2019-01-31 DIAGNOSIS — C4491 Basal cell carcinoma of skin, unspecified: Secondary | ICD-10-CM | POA: Diagnosis not present

## 2019-01-31 DIAGNOSIS — M13869 Other specified arthritis, unspecified knee: Secondary | ICD-10-CM | POA: Diagnosis not present

## 2019-02-07 DIAGNOSIS — Z1231 Encounter for screening mammogram for malignant neoplasm of breast: Secondary | ICD-10-CM | POA: Diagnosis not present

## 2019-02-07 DIAGNOSIS — Z1239 Encounter for other screening for malignant neoplasm of breast: Secondary | ICD-10-CM | POA: Diagnosis not present

## 2019-02-13 DIAGNOSIS — M170 Bilateral primary osteoarthritis of knee: Secondary | ICD-10-CM | POA: Diagnosis not present

## 2019-02-13 DIAGNOSIS — M47816 Spondylosis without myelopathy or radiculopathy, lumbar region: Secondary | ICD-10-CM | POA: Diagnosis not present

## 2019-02-28 DIAGNOSIS — I83893 Varicose veins of bilateral lower extremities with other complications: Secondary | ICD-10-CM | POA: Diagnosis not present

## 2019-03-08 DIAGNOSIS — M79606 Pain in leg, unspecified: Secondary | ICD-10-CM | POA: Diagnosis not present

## 2019-03-08 DIAGNOSIS — L853 Xerosis cutis: Secondary | ICD-10-CM | POA: Diagnosis not present

## 2019-03-08 DIAGNOSIS — G4700 Insomnia, unspecified: Secondary | ICD-10-CM | POA: Diagnosis not present

## 2019-03-08 DIAGNOSIS — L298 Other pruritus: Secondary | ICD-10-CM | POA: Diagnosis not present

## 2019-03-12 ENCOUNTER — Other Ambulatory Visit: Payer: Self-pay

## 2019-03-15 DIAGNOSIS — I83893 Varicose veins of bilateral lower extremities with other complications: Secondary | ICD-10-CM | POA: Diagnosis not present

## 2019-03-21 DIAGNOSIS — Z1159 Encounter for screening for other viral diseases: Secondary | ICD-10-CM | POA: Diagnosis not present

## 2019-03-21 DIAGNOSIS — I83893 Varicose veins of bilateral lower extremities with other complications: Secondary | ICD-10-CM | POA: Diagnosis not present

## 2019-03-28 DIAGNOSIS — I83893 Varicose veins of bilateral lower extremities with other complications: Secondary | ICD-10-CM | POA: Diagnosis not present

## 2019-03-28 DIAGNOSIS — Z1159 Encounter for screening for other viral diseases: Secondary | ICD-10-CM | POA: Diagnosis not present

## 2019-04-23 DIAGNOSIS — I83893 Varicose veins of bilateral lower extremities with other complications: Secondary | ICD-10-CM | POA: Diagnosis not present

## 2019-04-23 DIAGNOSIS — Z1159 Encounter for screening for other viral diseases: Secondary | ICD-10-CM | POA: Diagnosis not present

## 2019-04-27 DIAGNOSIS — G4700 Insomnia, unspecified: Secondary | ICD-10-CM | POA: Diagnosis not present

## 2019-04-27 DIAGNOSIS — R947 Abnormal results of other endocrine function studies: Secondary | ICD-10-CM | POA: Diagnosis not present

## 2019-04-27 DIAGNOSIS — M629 Disorder of muscle, unspecified: Secondary | ICD-10-CM | POA: Diagnosis not present

## 2019-04-27 DIAGNOSIS — G8310 Monoplegia of lower limb affecting unspecified side: Secondary | ICD-10-CM | POA: Diagnosis not present

## 2019-04-27 DIAGNOSIS — E349 Endocrine disorder, unspecified: Secondary | ICD-10-CM | POA: Diagnosis not present

## 2019-05-01 DIAGNOSIS — G8310 Monoplegia of lower limb affecting unspecified side: Secondary | ICD-10-CM | POA: Diagnosis not present

## 2019-05-01 DIAGNOSIS — E349 Endocrine disorder, unspecified: Secondary | ICD-10-CM | POA: Diagnosis not present

## 2019-05-03 DIAGNOSIS — N3941 Urge incontinence: Secondary | ICD-10-CM | POA: Diagnosis not present

## 2019-05-10 ENCOUNTER — Encounter: Payer: Self-pay | Admitting: Family Medicine

## 2019-05-10 DIAGNOSIS — Z1159 Encounter for screening for other viral diseases: Secondary | ICD-10-CM | POA: Diagnosis not present

## 2019-05-10 DIAGNOSIS — I83893 Varicose veins of bilateral lower extremities with other complications: Secondary | ICD-10-CM | POA: Diagnosis not present

## 2019-05-17 DIAGNOSIS — Z1159 Encounter for screening for other viral diseases: Secondary | ICD-10-CM | POA: Diagnosis not present

## 2019-05-17 DIAGNOSIS — I83893 Varicose veins of bilateral lower extremities with other complications: Secondary | ICD-10-CM | POA: Diagnosis not present

## 2019-05-23 DIAGNOSIS — Z1159 Encounter for screening for other viral diseases: Secondary | ICD-10-CM | POA: Diagnosis not present

## 2019-05-23 DIAGNOSIS — I83893 Varicose veins of bilateral lower extremities with other complications: Secondary | ICD-10-CM | POA: Diagnosis not present

## 2019-05-24 ENCOUNTER — Other Ambulatory Visit: Payer: Self-pay

## 2019-05-24 ENCOUNTER — Ambulatory Visit (INDEPENDENT_AMBULATORY_CARE_PROVIDER_SITE_OTHER): Payer: Medicare Other | Admitting: Family Medicine

## 2019-05-24 ENCOUNTER — Encounter: Payer: Self-pay | Admitting: Family Medicine

## 2019-05-24 ENCOUNTER — Telehealth: Payer: Self-pay | Admitting: Family Medicine

## 2019-05-24 VITALS — BP 124/70 | HR 74 | Temp 98.0°F | Resp 16 | Ht 63.0 in | Wt 138.8 lb

## 2019-05-24 DIAGNOSIS — Z1212 Encounter for screening for malignant neoplasm of rectum: Secondary | ICD-10-CM | POA: Diagnosis not present

## 2019-05-24 DIAGNOSIS — F5102 Adjustment insomnia: Secondary | ICD-10-CM

## 2019-05-24 DIAGNOSIS — F338 Other recurrent depressive disorders: Secondary | ICD-10-CM

## 2019-05-24 DIAGNOSIS — Z1211 Encounter for screening for malignant neoplasm of colon: Secondary | ICD-10-CM | POA: Diagnosis not present

## 2019-05-24 DIAGNOSIS — M170 Bilateral primary osteoarthritis of knee: Secondary | ICD-10-CM

## 2019-05-24 DIAGNOSIS — G4761 Periodic limb movement disorder: Secondary | ICD-10-CM

## 2019-05-24 MED ORDER — ZOLPIDEM TARTRATE 10 MG PO TABS: 10.0000 mg | ORAL_TABLET | Freq: Every evening | ORAL | 0 refills | Status: DC | PRN

## 2019-05-24 NOTE — Telephone Encounter (Signed)
Spoke with [facility] she is aware to continue medication at dose

## 2019-05-24 NOTE — Telephone Encounter (Signed)
See note  Copied from [place] [id number]. Topic: General - Inquiry >> May 24, 2019  4:02 PM Mathis Bud wrote: Reason for CRM: [facility] called stating they would like to recheck the dose of medication zolpidem (AMBIEN) 10 MG tablet  [phone]

## 2019-05-24 NOTE — Progress Notes (Signed)
Subjective  CC:  Chief Complaint  Patient presents with  . Re-Establish care  . Insomnia    HPI: Diane Murphy is a 73 y.o. female who presents to the office today to address the problems listed above in the chief complaint.  Diane Murphy is trying to move back to [place] from [place].  We discussed the reasons why.  Overall she continues to do well.  She has multiple chronic mild medical problems.  She is well known to me.  I reviewed her problem list and updated accordingly.  She continues to struggle with chronic insomnia.  She uses as needed Ambien that helps.  She denies symptoms of restless leg syndrome that is keeping awake.  She does have chronic aches and pains for which she attributes pain due to her recent varicose vein treatments.  Her mood is stable.  She does have some knee osteoarthritis.  She is due for colon cancer screening and she elects Cologuard.  She had a recent physical with her most recent PCP, I have reviewed lab work and all was stable at that time.  She has no other needs at this time. Assessment  1. Insomnia due to stress   2. PLMD (periodic limb movement disorder)   3. Primary osteoarthritis of both knees   4. Screening for colorectal cancer   5. Seasonal affective disorder ([place])      Plan   Insomnia: Chronic, primary: As needed Ambien.  She understands risk versus benefits.  Osteoarthritis, mild  Cologuard for colon cancer screening.  Counseling done.  Follow up: Return in about 6 months (around 11/22/2019) for complete physical.  Visit date not found  Orders Placed This Encounter  Procedures  . Cologuard   Meds ordered this encounter  Medications  . zolpidem (AMBIEN) 10 MG tablet    Sig: Take 1 tablet (10 mg total) by mouth at bedtime as needed for sleep.    Dispense:  30 tablet    Refill:  0      I reviewed the patients updated PMH, FH, and SocHx.    Patient Active Problem List   Diagnosis Date Noted  . PLMD (periodic limb movement  disorder) 02/16/2016    Priority: Medium  . Gastroesophageal reflux disease without esophagitis 09/09/2015    Priority: Medium  . Primary osteoarthritis involving multiple joints 09/09/2015    Priority: Medium  . Seasonal affective disorder ([place]) 09/09/2015    Priority: Medium  . Osteopenia 09/29/2014    Priority: Medium  . Insomnia due to stress 07/30/2014    Priority: Medium  . Restless leg syndrome 11/06/2018  . Bladder prolapse, female, acquired 11/06/2018  . Osteoarthritis of knee 11/03/2018  . Other idiopathic scoliosis, thoracolumbar region 11/03/2018  . Colon cancer screening- cologuard neg 2017 07/12/2017  . No vaccination-pt refuse 07/12/2017   Current Meds  Medication Sig  . progesterone (PROMETRIUM) 100 MG capsule 200 mg.     Allergies: Patient is allergic to ether and requip [ropinirole hcl]. Family History: Patient family history includes Alcoholism in her mother; Breast cancer (age of onset: 60) in her sister; Depression in her mother; Diabetes in her daughter and father. Social History:  Patient  reports that she has never smoked. She has never used smokeless tobacco. She reports that she does not drink alcohol or use drugs.  Review of Systems: Constitutional: Negative for fever malaise or anorexia Cardiovascular: negative for chest pain Respiratory: negative for SOB or persistent cough Gastrointestinal: negative for abdominal pain  Objective  Vitals: BP 124/70   Pulse 74   Temp 98 F (36.7 C) (Tympanic)   Resp 16   Ht 5\' 3"  (1.6 m)   Wt 138 lb 12.8 oz (63 kg)   SpO2 97%   BMI 24.59 kg/m  General: no acute distress , A&Ox3 HEENT: PEERL, conjunctiva normal, Oropharynx moist,neck is supple Cardiovascular:  RRR without murmur or gallop.  Respiratory:  Good breath sounds bilaterally, CTAB with normal respiratory effort Skin:  Warm, no rashes     Commons side effects, risks, benefits, and alternatives for medications and treatment plan prescribed  today were discussed, and the patient expressed understanding of the given instructions. Patient is instructed to call or message via MyChart if he/she has any questions or concerns regarding our treatment plan. No barriers to understanding were identified. We discussed Red Flag symptoms and signs in detail. Patient expressed understanding regarding what to do in case of urgent or emergency type symptoms.   Medication list was reconciled, printed and provided to the patient in AVS. Patient instructions and summary information was reviewed with the patient as documented in the AVS. This note was prepared with assistance of Dragon voice recognition software. Occasional wrong-word or sound-a-like substitutions may have occurred due to the inherent limitations of voice recognition software

## 2019-05-24 NOTE — Patient Instructions (Signed)
Please return in 6 months for your annual complete physical; please come fasting.  I recommend the Cologuard test for your colon cancer screening that is due. I have ordered this test for you. [facility] will soon contact you to verify your insurance, address etc. They will then send you the kit; follow the instructions in the kit and return the kit to C[facility]. They will run the test and send the results to me. I will then give you the results. If this test is negative, we recommend repeating a colon cancer screening test in 3 years. If it is positive, I will refer you to a Gastroenterologist so you can get set up for the recommended colonoscopy.  Thank you!   If you have any questions or concerns, please don't hesitate to send me a message via MyChart or call the office at [phone]. Thank you for visiting with [place] today! It's our pleasure caring for you.

## 2019-05-25 ENCOUNTER — Telehealth: Payer: Self-pay | Admitting: Family Medicine

## 2019-05-25 ENCOUNTER — Other Ambulatory Visit: Payer: Self-pay | Admitting: *Deleted

## 2019-05-25 MED ORDER — GABAPENTIN 100 MG PO CAPS: 100.0000 mg | ORAL_CAPSULE | Freq: Three times a day (TID) | ORAL | 3 refills | Status: DC

## 2019-05-25 NOTE — Telephone Encounter (Signed)
Medication added

## 2019-05-25 NOTE — Telephone Encounter (Signed)
Copied from [place] [id number]. Topic: General - Other >> May 25, 2019 10:35 AM Alease Frame wrote: Reason for CRM: patient called in to give name of medication shes been taking Gabapentin. She wanted Dr Jonni Sanger to know

## 2019-05-25 NOTE — Telephone Encounter (Signed)
See below

## 2019-05-30 DIAGNOSIS — I83893 Varicose veins of bilateral lower extremities with other complications: Secondary | ICD-10-CM | POA: Diagnosis not present

## 2019-05-30 DIAGNOSIS — Z1159 Encounter for screening for other viral diseases: Secondary | ICD-10-CM | POA: Diagnosis not present

## 2019-06-27 DIAGNOSIS — M13869 Other specified arthritis, unspecified knee: Secondary | ICD-10-CM | POA: Diagnosis not present

## 2019-06-27 DIAGNOSIS — J302 Other seasonal allergic rhinitis: Secondary | ICD-10-CM | POA: Diagnosis not present

## 2019-06-27 DIAGNOSIS — G4700 Insomnia, unspecified: Secondary | ICD-10-CM | POA: Diagnosis not present

## 2019-07-04 ENCOUNTER — Other Ambulatory Visit: Payer: Self-pay

## 2019-07-11 DIAGNOSIS — I83893 Varicose veins of bilateral lower extremities with other complications: Secondary | ICD-10-CM | POA: Diagnosis not present

## 2019-07-11 DIAGNOSIS — Z1159 Encounter for screening for other viral diseases: Secondary | ICD-10-CM | POA: Diagnosis not present

## 2019-07-17 DIAGNOSIS — Z1211 Encounter for screening for malignant neoplasm of colon: Secondary | ICD-10-CM | POA: Diagnosis not present

## 2019-07-17 DIAGNOSIS — Z1212 Encounter for screening for malignant neoplasm of rectum: Secondary | ICD-10-CM | POA: Diagnosis not present

## 2019-07-20 LAB — COLOGUARD: Cologuard: NEGATIVE

## 2019-08-14 ENCOUNTER — Ambulatory Visit (INDEPENDENT_AMBULATORY_CARE_PROVIDER_SITE_OTHER): Payer: Medicare Other | Admitting: Family Medicine

## 2019-08-14 ENCOUNTER — Encounter: Payer: Self-pay | Admitting: Family Medicine

## 2019-08-14 ENCOUNTER — Telehealth: Payer: Self-pay | Admitting: Family Medicine

## 2019-08-14 ENCOUNTER — Other Ambulatory Visit: Payer: Self-pay

## 2019-08-14 ENCOUNTER — Other Ambulatory Visit: Payer: Medicare Other

## 2019-08-14 VITALS — BP 122/78 | HR 93 | Temp 98.2°F | Ht 63.0 in | Wt 141.8 lb

## 2019-08-14 DIAGNOSIS — R300 Dysuria: Secondary | ICD-10-CM | POA: Diagnosis not present

## 2019-08-14 LAB — POCT URINALYSIS DIPSTICK
Bilirubin, UA: NEGATIVE
Blood, UA: NEGATIVE
Glucose, UA: NEGATIVE
Ketones, UA: NEGATIVE
Leukocytes, UA: NEGATIVE
Nitrite, UA: NEGATIVE
Protein, UA: NEGATIVE
Spec Grav, UA: 1.015 (ref 1.010–1.025)
Urobilinogen, UA: 0.2 E.U./dL
pH, UA: 7.5 (ref 5.0–8.0)

## 2019-08-14 MED ORDER — NITROFURANTOIN MONOHYD MACRO 100 MG PO CAPS: 100.0000 mg | ORAL_CAPSULE | Freq: Two times a day (BID) | ORAL | 0 refills | Status: DC

## 2019-08-14 NOTE — Telephone Encounter (Signed)
Please call to schedule appointment.   Copied from [place] [id number]. Topic: General - Other >> Aug 14, 2019  8:34 AM Leward Quan A wrote: Reason for CRM: Patient called to inform Dr Jonni Sanger that she may have a UTI and that she would like a Rx sent to her pharmacy. She is having abdominal pressure, urinary frequency no burning due to the amount of water intake. Per patient she will be in [place] area today and can come by and leave a urine sample. Please call patient on her cell at Ph# [phone]

## 2019-08-14 NOTE — Telephone Encounter (Signed)
Called pt and LVM to schedule appt

## 2019-08-14 NOTE — Progress Notes (Signed)
Subjective   CC:  Chief Complaint  Patient presents with  . Urinary Tract Infection    HPI: Diane Murphy is a 74 y.o. female who presents to the office today to address the problems listed above in the chief complaint.  Patient reports dysuria and urinary frequency.  She has sensation of increased urinary pressure. Sxs started yesterday. She took two cranberry tablets and has drank a lot of water. sxs have improved today.  She denies fevers flank pain nausea vomiting or gross hematuria.  Symptoms have been present for several hours to days.  She denies history of interstitial cystitis.  She denies vaginal symptoms including vaginal discharge or pelvic pain.   Assessment  1. Dysuria      Plan   Dysuria: nl urine today. Reassured. Pt lives in [place]: pt to monitor her sxs and if recur or worsen can take macrobid x 5 days. She will return for f/c/s or flank pain. She is told she currently does not seem to have a UTI but UA. Keep hydrated.   Follow up: No follow-ups on file.  Orders Placed This Encounter  Procedures  . UA POCT   Meds ordered this encounter  Medications  . nitrofurantoin, macrocrystal-monohydrate, (MACROBID) 100 MG capsule    Sig: Take 1 capsule (100 mg total) by mouth 2 (two) times daily.    Dispense:  10 capsule    Refill:  0      I reviewed the patients updated PMH, FH, and SocHx.    Patient Active Problem List   Diagnosis Date Noted  . PLMD (periodic limb movement disorder) 02/16/2016    Priority: Medium  . Gastroesophageal reflux disease without esophagitis 09/09/2015    Priority: Medium  . Primary osteoarthritis involving multiple joints 09/09/2015    Priority: Medium  . Seasonal affective disorder ([place]) 09/09/2015    Priority: Medium  . Osteopenia 09/29/2014    Priority: Medium  . Insomnia due to stress 07/30/2014    Priority: Medium  . Restless leg syndrome 11/06/2018  . Bladder prolapse, female, acquired 11/06/2018  . Osteoarthritis  of knee 11/03/2018  . Other idiopathic scoliosis, thoracolumbar region 11/03/2018  . Colon cancer screening- cologuard neg 2017 07/12/2017  . No vaccination-pt refuse 07/12/2017   No outpatient medications have been marked as taking for the 08/14/19 encounter (Office Visit) with Leamon Arnt, MD.    Review of Systems: Cardiovascular: negative for chest pain Respiratory: negative for SOB or persistent cough Gastrointestinal: negative for abdominal pain Constitutional: Negative for fever malaise or anorexia  Objective  Vitals: BP 122/78 (BP Location: Left Arm, Patient Position: Sitting, Cuff Size: Normal)   Pulse 93   Temp 98.2 F (36.8 C) (Skin)   Ht 5\' 3"  (1.6 m)   Wt 141 lb 12.8 oz (64.3 kg)   SpO2 96%   BMI 25.12 kg/m  General: no acute distress  Psych:  Alert and oriented, normal mood and affect Cardiovascular:  RRR without murmur or gallop. no peripheral edema Respiratory:  Good breath sounds bilaterally, CTAB with normal respiratory effort Gastrointestinal: soft, flat abdomen,NO CVAT, mild suprapubic ttp w/o rebound or guarding   Office Visit on 08/14/2019  Component Date Value Ref Range Status  . Color, UA 08/14/2019 Yellow   Final  . Clarity, UA 08/14/2019 Clear   Final  . Glucose, UA 08/14/2019 Negative  Negative Final  . Bilirubin, UA 08/14/2019 Negative   Final  . Ketones, UA 08/14/2019 Negative   Final  . Roderic Scarce,  UA 08/14/2019 1.015  1.010 - 1.025 Final  . Blood, UA 08/14/2019 Negative   Final  . pH, UA 08/14/2019 7.5  5.0 - 8.0 Final  . Protein, UA 08/14/2019 Negative  Negative Final  . Urobilinogen, UA 08/14/2019 0.2  0.2 or 1.0 E.U./dL Final  . Nitrite, UA 08/14/2019 Negative   Final  . Leukocytes, UA 08/14/2019 Negative  Negative Final    Commons side effects, risks, benefits, and alternatives for medications and treatment plan prescribed today were discussed, and the patient expressed understanding of the given instructions. Patient is instructed to  call or message via MyChart if he/she has any questions or concerns regarding our treatment plan. No barriers to understanding were identified. We discussed Red Flag symptoms and signs in detail. Patient expressed understanding regarding what to do in case of urgent or emergency type symptoms.   Medication list was reconciled, printed and provided to the patient in AVS. Patient instructions and summary information was reviewed with the patient as documented in the AVS. This note was prepared with assistance of Dragon voice recognition software. Occasional wrong-word or sound-a-like substitutions may have occurred due to the inherent limitations of voice recognition software

## 2019-08-14 NOTE — Patient Instructions (Signed)
Please follow up as scheduled for your next visit with me: 11/27/2019   If you have any questions or concerns, please don't hesitate to send me a message via MyChart or call the office at [phone]. Thank you for visiting with [place] today! It's our pleasure caring for you.

## 2019-08-20 ENCOUNTER — Encounter: Payer: Self-pay | Admitting: Family Medicine

## 2019-08-20 ENCOUNTER — Other Ambulatory Visit: Payer: Self-pay

## 2019-08-20 DIAGNOSIS — M47816 Spondylosis without myelopathy or radiculopathy, lumbar region: Secondary | ICD-10-CM

## 2019-08-24 ENCOUNTER — Other Ambulatory Visit: Payer: Self-pay

## 2019-08-24 ENCOUNTER — Telehealth: Payer: Self-pay

## 2019-08-24 ENCOUNTER — Telehealth: Payer: Self-pay | Admitting: Family Medicine

## 2019-08-24 ENCOUNTER — Other Ambulatory Visit (INDEPENDENT_AMBULATORY_CARE_PROVIDER_SITE_OTHER): Payer: Medicare Other

## 2019-08-24 DIAGNOSIS — R300 Dysuria: Secondary | ICD-10-CM

## 2019-08-24 LAB — POCT URINALYSIS DIPSTICK
Bilirubin, UA: NEGATIVE
Blood, UA: POSITIVE
Glucose, UA: NEGATIVE
Ketones, UA: NEGATIVE
Nitrite, UA: POSITIVE
Protein, UA: NEGATIVE
Spec Grav, UA: 1.01 (ref 1.010–1.025)
Urobilinogen, UA: 0.2 E.U./dL
pH, UA: 8 (ref 5.0–8.0)

## 2019-08-24 MED ORDER — SULFAMETHOXAZOLE-TRIMETHOPRIM 800-160 MG PO TABS: 1.0000 | ORAL_TABLET | Freq: Two times a day (BID) | ORAL | 0 refills | Status: DC

## 2019-08-24 NOTE — Telephone Encounter (Signed)
Pt states she [facility], [place], [place]. Please advise.

## 2019-08-24 NOTE — Telephone Encounter (Signed)
Patient stated that she missed a call and would like for someone to contact her back on her cell phone at [phone]

## 2019-08-24 NOTE — Telephone Encounter (Signed)
Please advise 

## 2019-08-24 NOTE — Telephone Encounter (Signed)
Patient has called in saying she was seen on 08/14/19 for a UTI and was prescribed medicine but she has run out of meds and the uti has returned. Patient states she did a urine sample at home and it was super cloudy and wanted to know if she can get an appointment or get a refill on that medicine.

## 2019-08-24 NOTE — Telephone Encounter (Signed)
Can order UA for patient if she wants to come in and give [place] a specimen. Her urine was normal last visit.

## 2019-08-24 NOTE — Telephone Encounter (Signed)
UA placed

## 2019-08-24 NOTE — Telephone Encounter (Signed)
Pharmacy updated in patient's chart.

## 2019-08-24 NOTE — Addendum Note (Signed)
Addended by: Billey Chang on: 08/24/2019 01:59 PM   Modules accepted: Orders

## 2019-08-24 NOTE — Progress Notes (Signed)
Diane Murphy, Please add on urine culture, dx: UTI. I've ordered it. Thanks, Dr. Jonni Sanger '  [place], please call pt; urine now looks infected; I've ordered an antibiotic and urine culture.  If symptoms persist then OV for recheck.  Thanks.

## 2019-08-26 LAB — URINE CULTURE
MICRO NUMBER:: 10047114
SPECIMEN QUALITY:: ADEQUATE

## 2019-08-27 NOTE — Telephone Encounter (Signed)
Returned patient's call. Patient notified of lab results.

## 2019-08-28 NOTE — Progress Notes (Signed)
Please call patient: I have reviewed his/her lab results. Her urine did show infection and it should be improved on the antibiotic I ordered last week. Let me know if she is not improved.   (septra)

## 2019-09-13 ENCOUNTER — Telehealth: Payer: Self-pay | Admitting: Family Medicine

## 2019-09-13 NOTE — Telephone Encounter (Signed)
Please advise 

## 2019-09-13 NOTE — Telephone Encounter (Signed)
Patient is calling in saying that she seems to be having a lot of Urinary tract infections and she says that she thinks she may have another one, Diane Murphy wants to know if she drop off a urine sample. Patient would like a call if nay questions or concerns.Marland Kitchen

## 2019-09-14 NOTE — Telephone Encounter (Signed)
Called patient, no answer. Left message for patient to go to the urgent care or to call the office back for an OV.

## 2019-09-14 NOTE — Telephone Encounter (Signed)
Needs office visit or can go to urgent care where she lives.

## 2019-09-17 ENCOUNTER — Other Ambulatory Visit: Payer: Self-pay

## 2019-09-17 ENCOUNTER — Ambulatory Visit (INDEPENDENT_AMBULATORY_CARE_PROVIDER_SITE_OTHER): Payer: Medicare Other | Admitting: Family Medicine

## 2019-09-17 ENCOUNTER — Encounter: Payer: Self-pay | Admitting: Family Medicine

## 2019-09-17 VITALS — BP 132/82 | HR 74 | Temp 97.6°F | Ht 63.0 in | Wt 139.4 lb

## 2019-09-17 DIAGNOSIS — E2839 Other primary ovarian failure: Secondary | ICD-10-CM

## 2019-09-17 DIAGNOSIS — N819 Female genital prolapse, unspecified: Secondary | ICD-10-CM

## 2019-09-17 DIAGNOSIS — R300 Dysuria: Secondary | ICD-10-CM

## 2019-09-17 DIAGNOSIS — M8589 Other specified disorders of bone density and structure, multiple sites: Secondary | ICD-10-CM

## 2019-09-17 DIAGNOSIS — N390 Urinary tract infection, site not specified: Secondary | ICD-10-CM | POA: Diagnosis not present

## 2019-09-17 LAB — POCT URINALYSIS DIPSTICK
Bilirubin, UA: NEGATIVE
Blood, UA: NEGATIVE
Glucose, UA: NEGATIVE
Ketones, UA: NEGATIVE
Nitrite, UA: NEGATIVE
Protein, UA: NEGATIVE
Spec Grav, UA: <=1.005 — AB (ref 1.010–1.025)
Urobilinogen, UA: 0.2 E.U./dL
pH, UA: 8 (ref 5.0–8.0)

## 2019-09-17 MED ORDER — ESTRADIOL 0.1 MG/GM VA CREA: 1.0000 | TOPICAL_CREAM | VAGINAL | 12 refills | Status: DC

## 2019-09-17 MED ORDER — SULFAMETHOXAZOLE-TRIMETHOPRIM 800-160 MG PO TABS: 1.0000 | ORAL_TABLET | Freq: Two times a day (BID) | ORAL | 0 refills | Status: DC

## 2019-09-17 NOTE — Progress Notes (Signed)
Subjective   CC:  Chief Complaint  Patient presents with  . Urinary Tract Infection    painful urination, started over a month ago after taking last antibiotics    HPI: Diane Murphy is a 74 y.o. female who presents to the office today to address the problems listed above in the chief complaint.  Patient reports dysuria and urinary frequency.  She has sensation of increased urinary pressure - she reports that this increased pressure is daily and has been ongoing x weeks. Reports h/o "prolapse": saw uro-gyn about 10 years ago. Discussed pessary/surgery. No h/o recurrent UTI but had klebsiella UTI earlier last month treated successfully with septra.  She denies fevers flank pain nausea vomiting or gross hematuria.  Symptoms have been present for several hours to days.  She denies history of interstitial cystitis.  She denies vaginal symptoms including vaginal discharge or pelvic pain. She is postmenopausal. Denies mass or bulging at introitus or irritation of skin.   HM: will be due for f/u dexa in April, mammo in july  Assessment  1. Recurrent UTI   2. Dysuria   3. Hypoestrogenism   4. Osteopenia of multiple sites   5. Female genital prolapse, unspecified type      Plan   Recurrent UTI is postmenopausal female with cystocoele: education given. See AVS. Treat with abx and await culture. rec estrogen vaginal cream. F/u in April; consider uro-gyn referral if pressure sxs persist.   Ordered dexa.   Follow up: as scheduled.  11/27/2019   Orders Placed This Encounter  Procedures  . Urine Culture  . DG Bone Density  . POCT Urinalysis Dipstick   No orders of the defined types were placed in this encounter.     I reviewed the patients updated PMH, FH, and SocHx.    Patient Active Problem List   Diagnosis Date Noted  . PLMD (periodic limb movement disorder) 02/16/2016    Priority: Medium  . Gastroesophageal reflux disease without esophagitis 09/09/2015    Priority: Medium    . Primary osteoarthritis involving multiple joints 09/09/2015    Priority: Medium  . Seasonal affective disorder ([place]) 09/09/2015    Priority: Medium  . Osteopenia 09/29/2014    Priority: Medium  . Insomnia due to stress 07/30/2014    Priority: Medium  . Lumbar spondylosis 08/20/2019  . Restless leg syndrome 11/06/2018  . Bladder prolapse, female, acquired 11/06/2018  . Osteoarthritis of knee 11/03/2018  . Other idiopathic scoliosis, thoracolumbar region 11/03/2018  . Colon cancer screening- cologuard neg 2017 07/12/2017  . No vaccination-pt refuse 07/12/2017   Current Meds  Medication Sig  . BIOTIN PO Take 10,000 mcg by mouth daily.   . Calcium Carb-Cholecalciferol (CALCIUM-VITAMIN D3) 600-500 MG-UNIT CAPS Take by mouth.  . Cholecalciferol ([facility] VIT D 5000 HIGH-POTENCY PO) Take 1 tablet by mouth daily.   . Coenzyme Q-10 100 MG capsule Take 100 mg by mouth daily.   . Cranberry-Vit C-Lactobacillus (CRANBERRY/PROBIOTIC/VIT C PO) Take 2 tablets by mouth daily.   Marland Kitchen gabapentin (NEURONTIN) 100 MG capsule Take 1 capsule (100 mg total) by mouth 3 (three) times daily.  . Multiple Vitamin (MULTIVITAMIN) tablet Take 1 tablet by mouth.  . Omega-3 Fatty Acids (OMEGA-3 FISH OIL PO) Take 1,400 mg by mouth daily.  Marland Kitchen zolpidem (AMBIEN) 10 MG tablet Take 1 tablet (10 mg total) by mouth at bedtime as needed for sleep.    Review of Systems: Cardiovascular: negative for chest pain Respiratory: negative for SOB or persistent cough  Gastrointestinal: negative for abdominal pain Constitutional: Negative for fever malaise or anorexia  Objective  Vitals: BP 132/82 (BP Location: Right Arm, Patient Position: Sitting, Cuff Size: Normal)   Pulse 74   Temp 97.6 F (36.4 C) (Temporal)   Ht 5\' 3"  (1.6 m)   Wt 139 lb 6.4 oz (63.2 kg)   SpO2 98%   BMI 24.69 kg/m  General: no acute distress  Psych:  Alert and oriented, normal mood and affect Cardiovascular:  RRR without murmur or gallop. no peripheral  edema Respiratory:  Good breath sounds bilaterally, CTAB with normal respiratory effort Gastrointestinal: soft, flat abdomen, normal active bowel sounds, no palpable masses, no hepatosplenomegaly, no appreciated hernias, NO CVAT, mild suprapubic ttp w/o rebound or guarding Gyn: normal appearing vulva and introitus; + cystoceole with valsalva, atrophic vaginal mucosa Skin:  Warm, no rashes Neurologic:   Mental status is normal. normal gait Office Visit on 09/17/2019  Component Date Value Ref Range Status  . Color, UA 09/17/2019 Golden   Final  . Clarity, UA 09/17/2019 Clear   Final  . Glucose, UA 09/17/2019 Negative  Negative Final  . Bilirubin, UA 09/17/2019 Negative   Final  . Ketones, UA 09/17/2019 Negative   Final  . Spec Grav, UA 09/17/2019 <=1.005* 1.010 - 1.025 Final  . Blood, UA 09/17/2019 Negative   Final  . pH, UA 09/17/2019 8.0  5.0 - 8.0 Final  . Protein, UA 09/17/2019 Negative  Negative Final  . Urobilinogen, UA 09/17/2019 0.2  0.2 or 1.0 E.U./dL Final  . Nitrite, UA 09/17/2019 Negative   Final  . Leukocytes, UA 09/17/2019 Large (3+)* Negative Final    Commons side effects, risks, benefits, and alternatives for medications and treatment plan prescribed today were discussed, and the patient expressed understanding of the given instructions. Patient is instructed to call or message via MyChart if he/she has any questions or concerns regarding our treatment plan. No barriers to understanding were identified. We discussed Red Flag symptoms and signs in detail. Patient expressed understanding regarding what to do in case of urgent or emergency type symptoms.   Medication list was reconciled, printed and provided to the patient in AVS. Patient instructions and summary information was reviewed with the patient as documented in the AVS. This note was prepared with assistance of Dragon voice recognition software. Occasional wrong-word or sound-a-like substitutions may have occurred due to  the inherent limitations of voice recognition software

## 2019-09-17 NOTE — Telephone Encounter (Signed)
Patient has an appointment 09/17/2019

## 2019-09-17 NOTE — Patient Instructions (Signed)
Please follow up as scheduled for your next visit with me: 11/27/2019   You have another bladder infection; please complete the antibiotics again.   Use the vaginal estrogen cream about 3x/week to help.  See below.  If you have any questions or concerns, please don't hesitate to send me a message via MyChart or call the office at [phone]. Thank you for visiting with [place] today! It's our pleasure caring for you.   Pelvic Organ Prolapse Pelvic organ prolapse is the stretching, bulging, or dropping of pelvic organs into an abnormal position. It happens when the muscles and tissues that surround and support pelvic structures become weak or stretched. Pelvic organ prolapse can involve the:  Vagina (vaginal prolapse).  Uterus (uterine prolapse).  Bladder (cystocele).  Rectum (rectocele).  Intestines (enterocele). When organs other than the vagina are involved, they often bulge into the vagina or protrude from the vagina, depending on how severe the prolapse is. What are the causes? This condition may be caused by:  Pregnancy, labor, and childbirth.  Past pelvic surgery.  Decreased production of the hormone estrogen associated with menopause.  Consistently lifting more than 50 lb (23 kg).  Obesity.  Long-term inability to pass stool (chronic constipation).  A cough that lasts a long time (chronic).  Buildup of fluid in the abdomen due to certain diseases and other conditions. What are the signs or symptoms? Symptoms of this condition include:  Passing a little urine (loss of bladder control) when you cough, sneeze, strain, and exercise (stress incontinence). This may be worse immediately after childbirth. It may gradually improve over time.  Feeling pressure in your pelvis or vagina. This pressure may increase when you cough or when you are passing stool.  A bulge that protrudes from the opening of your vagina.  Difficulty passing urine or stool.  Pain in your lower  back.  Pain, discomfort, or disinterest in sex.  Repeated bladder infections (urinary tract infections).  Difficulty inserting a tampon. In some people, this condition causes no symptoms. How is this diagnosed? This condition may be diagnosed based on a vaginal and rectal exam. During the exam, you may be asked to cough and strain while you are lying down, sitting, and standing up. Your health care provider will determine if other tests are required, such as bladder function tests. How is this treated? Treatment for this condition may depend on your symptoms. Treatment may include:  Lifestyle changes, such as changes to your diet.  Emptying your bladder at scheduled times (bladder training therapy). This can help reduce or avoid urinary incontinence.  Estrogen. Estrogen may help mild prolapse by increasing the strength and tone of pelvic floor muscles.  Kegel exercises. These may help mild cases of prolapse by strengthening and tightening the muscles of the pelvic floor.  A soft, flexible device that helps support the vaginal walls and keep pelvic organs in place (pessary). This is inserted into your vagina by your health care provider.  Surgery. This is often the only form of treatment for severe prolapse. Follow these instructions at home:  Avoid drinking beverages that contain caffeine or alcohol.  Increase your intake of high-fiber foods. This can help decrease constipation and straining during bowel movements.  Lose weight if recommended by your health care provider.  Wear a sanitary pad or adult diapers if you have urinary incontinence.  Avoid heavy lifting and straining with exercise and work. Do not hold your breath when you perform mild to moderate lifting and exercise activities.  Limit your activities as directed by your health care provider.  Do Kegel exercises as directed by your health care provider. To do this: ? Squeeze your pelvic floor muscles tight. You should  feel a tight lift in your rectal area and a tightness in your vaginal area. Keep your stomach, buttocks, and legs relaxed. ? Hold the muscles tight for up to 10 seconds. ? Relax your muscles. ? Repeat this exercise 50 times a day, or as many times as told by your health care provider. Continue to do this exercise for at least 4-6 weeks, or for as long as told by your health care provider.  Take over-the-counter and prescription medicines only as told by your health care provider.  If you have a pessary, take care of it as told by your health care provider.  Keep all follow-up visits as told by your health care provider. This is important. Contact a health care provider if you:  Have symptoms that interfere with your daily activities or sex life.  Need medicine to help with the discomfort.  Notice bleeding from your vagina that is not related to your period.  Have a fever.  Have pain or bleeding when you urinate.  Have bleeding when you pass stool.  Pass urine when you have sex.  Have chronic constipation.  Have a pessary that falls out.  Have bad smelling vaginal discharge.  Have an unusual, low pain in your abdomen. Summary  Pelvic organ prolapse is the stretching, bulging, or dropping of pelvic organs into an abnormal position. It happens when the muscles and tissues that surround and support pelvic structures become weak or stretched.  When organs other than the vagina are involved, they often bulge into the vagina or protrude from the vagina, depending on how severe the prolapse is.  In most cases, this condition needs to be treated only if it produces symptoms. Treatment may include lifestyle changes, estrogen, Kegel exercises, pessary insertion, or surgery.  Avoid heavy lifting and straining with exercise and work. Do not hold your breath when you perform mild to moderate lifting and exercise activities. Limit your activities as directed by your health care provider.  This information is not intended to replace advice given to you by your health care provider. Make sure you discuss any questions you have with your health care provider. Document Revised: 08/17/2017 Document Reviewed: 08/17/2017 Elsevier Patient Education  [place].  Urinary Tract Infection, Adult  A urinary tract infection (UTI) is an infection of any part of the urinary tract. The urinary tract includes the kidneys, ureters, bladder, and urethra. These organs make, store, and get rid of urine in the body. Your health care provider may use other names to describe the infection. An upper UTI affects the ureters and kidneys (pyelonephritis). A lower UTI affects the bladder (cystitis) and urethra (urethritis). What are the causes? Most urinary tract infections are caused by bacteria in your genital area, around the entrance to your urinary tract (urethra). These bacteria grow and cause inflammation of your urinary tract. What increases the risk? You are more likely to develop this condition if:  You have a urinary catheter that stays in place (indwelling).  You are not able to control when you urinate or have a bowel movement (you have incontinence).  You are female and you: ? Use a spermicide or diaphragm for birth control. ? Have low estrogen levels. ? Are pregnant.  You have certain genes that increase your risk (genetics).  You  are sexually active.  You take antibiotic medicines.  You have a condition that causes your flow of urine to slow down, such as: ? An enlarged prostate, if you are female. ? Blockage in your urethra (stricture). ? A kidney stone. ? A nerve condition that affects your bladder control (neurogenic bladder). ? Not getting enough to drink, or not urinating often.  You have certain medical conditions, such as: ? Diabetes. ? A weak disease-fighting system (immunesystem). ? Sickle cell disease. ? Gout. ? Spinal cord injury. What are the signs or  symptoms? Symptoms of this condition include:  Needing to urinate right away (urgently).  Frequent urination or passing small amounts of urine frequently.  Pain or burning with urination.  Blood in the urine.  Urine that smells bad or unusual.  Trouble urinating.  Cloudy urine.  Vaginal discharge, if you are female.  Pain in the abdomen or the lower back. You may also have:  Vomiting or a decreased appetite.  Confusion.  Irritability or tiredness.  A fever.  Diarrhea. The first symptom in older adults may be confusion. In some cases, they may not have any symptoms until the infection has worsened. How is this diagnosed? This condition is diagnosed based on your medical history and a physical exam. You may also have other tests, including:  Urine tests.  Blood tests.  Tests for sexually transmitted infections (STIs). If you have had more than one UTI, a cystoscopy or imaging studies may be done to determine the cause of the infections. How is this treated? Treatment for this condition includes:  Antibiotic medicine.  Over-the-counter medicines to treat discomfort.  Drinking enough water to stay hydrated. If you have frequent infections or have other conditions such as a kidney stone, you may need to see a health care provider who specializes in the urinary tract (urologist). In rare cases, urinary tract infections can cause sepsis. Sepsis is a life-threatening condition that occurs when the body responds to an infection. Sepsis is treated in the hospital with IV antibiotics, fluids, and other medicines. Follow these instructions at home:  Medicines  Take over-the-counter and prescription medicines only as told by your health care provider.  If you were prescribed an antibiotic medicine, take it as told by your health care provider. Do not stop using the antibiotic even if you start to feel better. General instructions  Make sure you: ? Empty your bladder  often and completely. Do not hold urine for long periods of time. ? Empty your bladder after sex. ? Wipe from front to back after a bowel movement if you are female. Use each tissue one time when you wipe.  Drink enough fluid to keep your urine pale yellow.  Keep all follow-up visits as told by your health care provider. This is important. Contact a health care provider if:  Your symptoms do not get better after 1-2 days.  Your symptoms go away and then return. Get help right away if you have:  Severe pain in your back or your lower abdomen.  A fever.  Nausea or vomiting. Summary  A urinary tract infection (UTI) is an infection of any part of the urinary tract, which includes the kidneys, ureters, bladder, and urethra.  Most urinary tract infections are caused by bacteria in your genital area, around the entrance to your urinary tract (urethra).  Treatment for this condition often includes antibiotic medicines.  If you were prescribed an antibiotic medicine, take it as told by your health care provider.  Do not stop using the antibiotic even if you start to feel better.  Keep all follow-up visits as told by your health care provider. This is important. This information is not intended to replace advice given to you by your health care provider. Make sure you discuss any questions you have with your health care provider. Document Revised: 07/13/2018 Document Reviewed: 02/02/2018 Elsevier Patient Education  2020 [employer].

## 2019-09-19 LAB — URINE CULTURE
MICRO NUMBER:: 10128884
SPECIMEN QUALITY:: ADEQUATE

## 2019-09-19 NOTE — Progress Notes (Signed)
Please call patient: I have reviewed his/her lab results. Urine culture shows recurrent bladder infection; please complete antibiotics and return if symptoms return. Thanks.

## 2019-10-05 ENCOUNTER — Telehealth: Payer: Self-pay | Admitting: Family Medicine

## 2019-10-05 NOTE — Telephone Encounter (Signed)
Spoke with pt. She has not been seen at an urgent care yet. Pt is confused about what was said to her last time. She wants to know if you need to let the urgent care know that she is coming before she goes. She states that [facility] is open tomorrow if she needs to go. Please advise.

## 2019-10-05 NOTE — Telephone Encounter (Signed)
See below

## 2019-10-05 NOTE — Telephone Encounter (Signed)
No, she can go to UC whenever she needs to.  Just have them send me the records when she is done.

## 2019-10-05 NOTE — Telephone Encounter (Signed)
Pt misheard. I want her to have the UC send me the records of her visit please.   Thanks.

## 2019-10-05 NOTE — Telephone Encounter (Signed)
Patient is calling in this morning saying she has another UTI and the last time she was here, Dr.Andy told her she would call an urgent care for her and the patient states that she is going to [place] phone: [phone]

## 2019-10-05 NOTE — Telephone Encounter (Signed)
Pt notified and given fax number.

## 2019-10-09 ENCOUNTER — Telehealth: Payer: Self-pay | Admitting: Family Medicine

## 2019-10-09 ENCOUNTER — Other Ambulatory Visit: Payer: Self-pay

## 2019-10-09 DIAGNOSIS — N390 Urinary tract infection, site not specified: Secondary | ICD-10-CM

## 2019-10-09 DIAGNOSIS — N819 Female genital prolapse, unspecified: Secondary | ICD-10-CM

## 2019-10-09 NOTE — Telephone Encounter (Signed)
Pt called requesting a referral to a doctor who specializes in both urology and gynecology. Pt asked to call her back on her cell phone. Please advise.

## 2019-10-09 NOTE — Telephone Encounter (Signed)
Ok to place referral? Please advise. Thanks!  

## 2019-10-09 NOTE — Telephone Encounter (Signed)
Urogyn referral placed for recurrent uti and prolapse female organ. Noted pt stays in [place].

## 2019-10-09 NOTE — Telephone Encounter (Signed)
Yes, urogyn is fine. I think she lives far away so need to note that in the referral. Thanks. Dx: recurrent uti and prolapse female organ

## 2019-10-22 ENCOUNTER — Telehealth: Payer: Self-pay | Admitting: Family Medicine

## 2019-10-22 NOTE — Telephone Encounter (Signed)
I called the patient to schedule AWV-I with Loma Sousa ([place]).  She said that she is busy right now and will call back later to schedule it. If patient calls back, please schedule Medicare Wellness Visit at next available opening. VDM ([place])

## 2019-11-26 ENCOUNTER — Ambulatory Visit: Payer: Medicare Other | Admitting: Family Medicine

## 2019-11-27 ENCOUNTER — Ambulatory Visit: Payer: Medicare Other | Admitting: Family Medicine

## 2019-11-28 ENCOUNTER — Other Ambulatory Visit: Payer: Self-pay

## 2019-11-29 ENCOUNTER — Encounter (INDEPENDENT_AMBULATORY_CARE_PROVIDER_SITE_OTHER): Payer: Self-pay

## 2019-11-29 ENCOUNTER — Ambulatory Visit (INDEPENDENT_AMBULATORY_CARE_PROVIDER_SITE_OTHER): Payer: Medicare Other

## 2019-11-29 ENCOUNTER — Encounter: Payer: Medicare Other | Admitting: Family Medicine

## 2019-11-29 DIAGNOSIS — Z0000 Encounter for general adult medical examination without abnormal findings: Secondary | ICD-10-CM
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
## 2019-11-29 NOTE — Patient Instructions (Signed)
Diane Murphy , Thank you for taking time to come for your Medicare Wellness Visit. I appreciate your ongoing commitment to your health goals. Please review the following plan we discussed and let me know if I can assist you in the future.   Screening recommendations/referrals: Colorectal Screening: up to date; Cologuard normal 07/20/19 Mammogram: up to date; last 02/07/19 Bone Density: up to date; last 11/16/17   Vision and Dental Exams: Recommended annual ophthalmology exams for early detection of glaucoma and other disorders of the eye Recommended annual dental exams for proper oral hygiene  Vaccinations: see handout for recommendations  Influenza vaccine:  Pneumococcal vaccine:  Tdap vaccine:  Shingles vaccine:  Covid vaccine:  Completed   Advanced directives: Advance directives discussed with you today. I have provided a copy for you to complete at home and have notarized. Once this is complete please bring a copy in to our office so we can scan it into your chart.  Goals: Recommend to drink at least 6-8 8oz glasses of water per day and consume a balanced diet rich in fresh fruits and vegetables.   Next appointment: Please schedule your Annual Wellness Visit with your Nurse Health Advisor in one year.  Preventive Care 64 Years and Older, Female Preventive care refers to lifestyle choices and visits with your health care provider that can promote health and wellness. What does preventive care include?  A yearly physical exam. This is also called an annual well check.  Dental exams once or twice a year.  Routine eye exams. Ask your health care provider how often you should have your eyes checked.  Personal lifestyle choices, including:  Daily care of your teeth and gums.  Regular physical activity.  Eating a healthy diet.  Avoiding tobacco and drug use.  Limiting alcohol use.  Practicing safe sex.  Taking low-dose aspirin every day if recommended by your health care  provider.  Taking vitamin and mineral supplements as recommended by your health care provider. What happens during an annual well check? The services and screenings done by your health care provider during your annual well check will depend on your age, overall health, lifestyle risk factors, and family history of disease. Counseling  Your health care provider may ask you questions about your:  Alcohol use.  Tobacco use.  Drug use.  Emotional well-being.  Home and relationship well-being.  Sexual activity.  Eating habits.  History of falls.  Memory and ability to understand (cognition).  Work and work Statistician.  Reproductive health. Screening  You may have the following tests or measurements:  Height, weight, and BMI.  Blood pressure.  Lipid and cholesterol levels. These may be checked every 5 years, or more frequently if you are over 58 years old.  Skin check.  Lung cancer screening. You may have this screening every year starting at age 15 if you have a 30-pack-year history of smoking and currently smoke or have quit within the past 15 years.  Fecal occult blood test (FOBT) of the stool. You may have this test every year starting at age 58.  Flexible sigmoidoscopy or colonoscopy. You may have a sigmoidoscopy every 5 years or a colonoscopy every 10 years starting at age 34.  Hepatitis C blood test.  Hepatitis B blood test.  Sexually transmitted disease (STD) testing.  Diabetes screening. This is done by checking your blood sugar (glucose) after you have not eaten for a while (fasting). You may have this done every 1-3 years.  Bone density scan.  This is done to screen for osteoporosis. You may have this done starting at age 48.  Mammogram. This may be done every 1-2 years. Talk to your health care provider about how often you should have regular mammograms. Talk with your health care provider about your test results, treatment options, and if necessary, the  need for more tests. Vaccines  Your health care provider may recommend certain vaccines, such as:  Influenza vaccine. This is recommended every year.  Tetanus, diphtheria, and acellular pertussis (Tdap, Td) vaccine. You may need a Td booster every 10 years.  Zoster vaccine. You may need this after age 59.  Pneumococcal 13-valent conjugate (PCV13) vaccine. One dose is recommended after age 37.  Pneumococcal polysaccharide (PPSV23) vaccine. One dose is recommended after age 31. Talk to your health care provider about which screenings and vaccines you need and how often you need them. This information is not intended to replace advice given to you by your health care provider. Make sure you discuss any questions you have with your health care provider. Document Released: 08/22/2015 Document Revised: 04/14/2016 Document Reviewed: 05/27/2015 Elsevier Interactive Patient Education  2017 [place] Prevention in the Home Falls can cause injuries. They can happen to people of all ages. There are many things you can do to make your home safe and to help prevent falls. What can I do on the outside of my home?  Regularly fix the edges of walkways and driveways and fix any cracks.  Remove anything that might make you trip as you walk through a door, such as a raised step or threshold.  Trim any bushes or trees on the path to your home.  Use bright outdoor lighting.  Clear any walking paths of anything that might make someone trip, such as rocks or tools.  Regularly check to see if handrails are loose or broken. Make sure that both sides of any steps have handrails.  Any raised decks and porches should have guardrails on the edges.  Have any leaves, snow, or ice cleared regularly.  Use sand or salt on walking paths during winter.  Clean up any spills in your garage right away. This includes oil or grease spills. What can I do in the bathroom?  Use night lights.  Install grab  bars by the toilet and in the tub and shower. Do not use towel bars as grab bars.  Use non-skid mats or decals in the tub or shower.  If you need to sit down in the shower, use a plastic, non-slip stool.  Keep the floor dry. Clean up any water that spills on the floor as soon as it happens.  Remove soap buildup in the tub or shower regularly.  Attach bath mats securely with double-sided non-slip rug tape.  Do not have throw rugs and other things on the floor that can make you trip. What can I do in the bedroom?  Use night lights.  Make sure that you have a light by your bed that is easy to reach.  Do not use any sheets or blankets that are too big for your bed. They should not hang down onto the floor.  Have a firm chair that has side arms. You can use this for support while you get dressed.  Do not have throw rugs and other things on the floor that can make you trip. What can I do in the kitchen?  Clean up any spills right away.  Avoid walking on wet floors.  Keep items that you use a lot in easy-to-reach places.  If you need to reach something above you, use a strong step stool that has a grab bar.  Keep electrical cords out of the way.  Do not use floor polish or wax that makes floors slippery. If you must use wax, use non-skid floor wax.  Do not have throw rugs and other things on the floor that can make you trip. What can I do with my stairs?  Do not leave any items on the stairs.  Make sure that there are handrails on both sides of the stairs and use them. Fix handrails that are broken or loose. Make sure that handrails are as long as the stairways.  Check any carpeting to make sure that it is firmly attached to the stairs. Fix any carpet that is loose or worn.  Avoid having throw rugs at the top or bottom of the stairs. If you do have throw rugs, attach them to the floor with carpet tape.  Make sure that you have a light switch at the top of the stairs and the  bottom of the stairs. If you do not have them, ask someone to add them for you. What else can I do to help prevent falls?  Wear shoes that:  Do not have high heels.  Have rubber bottoms.  Are comfortable and fit you well.  Are closed at the toe. Do not wear sandals.  If you use a stepladder:  Make sure that it is fully opened. Do not climb a closed stepladder.  Make sure that both sides of the stepladder are locked into place.  Ask someone to hold it for you, if possible.  Clearly mark and make sure that you can see:  Any grab bars or handrails.  First and last steps.  Where the edge of each step is.  Use tools that help you move around (mobility aids) if they are needed. These include:  Canes.  Walkers.  Scooters.  Crutches.  Turn on the lights when you go into a dark area. Replace any light bulbs as soon as they burn out.  Set up your furniture so you have a clear path. Avoid moving your furniture around.  If any of your floors are uneven, fix them.  If there are any pets around you, be aware of where they are.  Review your medicines with your doctor. Some medicines can make you feel dizzy. This can increase your chance of falling. Ask your doctor what other things that you can do to help prevent falls. This information is not intended to replace advice given to you by your health care provider. Make sure you discuss any questions you have with your health care provider. Document Released: 05/22/2009 Document Revised: 01/01/2016 Document Reviewed: 08/30/2014 Elsevier Interactive Patient Education  2017 [employer].

## 2019-11-30 ENCOUNTER — Telehealth: Payer: Self-pay

## 2019-11-30 NOTE — Telephone Encounter (Signed)
Please advise what OTC meds patient can take for insomnia

## 2019-11-30 NOTE — Telephone Encounter (Signed)
Patient would like to know medication that she can and cannot take. Patient states that she had trouble sleeping last night.

## 2019-12-03 NOTE — Telephone Encounter (Signed)
LMOVM advising patient to return call  

## 2019-12-03 NOTE — Telephone Encounter (Signed)
Would try medication in this order.Marland Kitchen 1) melatonin  2) ashwagandha  3) lavendar has some decent studies on helping you relax/sleep. Can get a diffuser with lavender essential oil.   If these do not help recommend you follow up with dr. Jonni Sanger, but it's a start and natural. Also follow good sleep hygiene which includes no caffeine after 2pm and no naps after 2pm. Daily exercise and getting outside. No TV or screen time 30 minutes before bed and using the bed for sex and sleep only.   Hopefully this helps!  Dr. Rogers Blocker

## 2019-12-05 NOTE — Telephone Encounter (Signed)
Called pt to give her the message below. Pt says that she has tried all of these, and they do not help. She says that she has foot and leg pain that cause her to have problems sleeping. She also says that she has an appointment in the morning with Orthopedics.

## 2019-12-05 NOTE — Telephone Encounter (Signed)
Patient returned Diane Murphy's phone call.    Patient states she is having foot pain.  I do not see anything in relation in this phone note.     Is requesting call back in regard.

## 2019-12-10 NOTE — Telephone Encounter (Signed)
Chronic insomnia; we have discussed. No further recs at this time.  Can f/u with ortho for foot pain.  Would rec office visit with me if anything further is needed or comes up.  Thanks.

## 2019-12-12 ENCOUNTER — Other Ambulatory Visit: Payer: Self-pay

## 2019-12-12 MED ORDER — ZOLPIDEM TARTRATE 10 MG PO TABS: 10.0000 mg | ORAL_TABLET | Freq: Every evening | ORAL | 2 refills | Status: DC | PRN

## 2019-12-12 MED ORDER — TRAMADOL HCL 50 MG PO TABS: 50.0000 mg | ORAL_TABLET | Freq: Every day | ORAL | 0 refills | Status: DC | PRN

## 2019-12-12 NOTE — Telephone Encounter (Signed)
ROI sent to [employer]

## 2019-12-12 NOTE — Telephone Encounter (Signed)
Patient has seen Dr. Layne Benton (ortho) last week, recommended surgery. Patient states she currently does not want surgery. Requesting script for Tramadol, states that Dr. Layne Benton told her it needed to come from Dr. Jonni Sanger. Also requesting refill on Ambien script.   [employer] [phone]

## 2019-12-12 NOTE — Telephone Encounter (Signed)
I have refilled ambien and ordered tramadol. Please get notes from Dr. Layne Benton so I am aware of what her diagnosis is and what I am treating.   Thanks.

## 2019-12-12 NOTE — Addendum Note (Signed)
Addended by: Billey Chang on: 12/12/2019 04:00 PM   Modules accepted: Orders

## 2019-12-18 ENCOUNTER — Encounter: Payer: Self-pay | Admitting: Family Medicine

## 2019-12-18 ENCOUNTER — Other Ambulatory Visit: Payer: Self-pay

## 2019-12-18 ENCOUNTER — Telehealth (INDEPENDENT_AMBULATORY_CARE_PROVIDER_SITE_OTHER): Payer: Medicare Other | Admitting: Family Medicine

## 2019-12-18 DIAGNOSIS — F5104 Psychophysiologic insomnia: Secondary | ICD-10-CM | POA: Diagnosis not present

## 2019-12-18 DIAGNOSIS — G4761 Periodic limb movement disorder: Secondary | ICD-10-CM | POA: Diagnosis not present

## 2019-12-18 DIAGNOSIS — N819 Female genital prolapse, unspecified: Secondary | ICD-10-CM

## 2019-12-18 DIAGNOSIS — M47816 Spondylosis without myelopathy or radiculopathy, lumbar region: Secondary | ICD-10-CM

## 2019-12-18 DIAGNOSIS — M48062 Spinal stenosis, lumbar region with neurogenic claudication: Secondary | ICD-10-CM | POA: Diagnosis not present

## 2019-12-18 DIAGNOSIS — M48061 Spinal stenosis, lumbar region without neurogenic claudication: Secondary | ICD-10-CM | POA: Insufficient documentation

## 2019-12-18 NOTE — Progress Notes (Signed)
Virtual Visit via Video Note  Subjective  CC:  Chief Complaint  Patient presents with  . Insomnia    has taken OTC melatonin, rarely using Ambien 2-3 times a month     I connected with Diane Murphy on 12/18/19 at 11:30 AM EDT by a video enabled telemedicine application and verified that I am speaking with the correct person using two identifiers. Location patient: Home Location provider: [facility] [place], Office Persons participating in the virtual visit: Diane Murphy, Leamon Arnt, MD Reymundo Poll CMA  I discussed the limitations of evaluation and management by telemedicine and the availability of in person appointments. The patient expressed understanding and agreed to proceed. HPI: Diane Murphy is a 74 y.o. female who was contacted today to address the problems listed above in the chief complaint. . I reviewed historical notes regarding her history of insomnia dating back to 2016. Reviewed sleep study results from 2017. Pt complains of persistent sleep problems: now mainly early awakening due to lower leg pain. Has been evaluated by SM for same; reviewed recent MRI report showing diffuse DJD in lumbar spine with associated severe spinal stenosis. She has since had 1 epidural steroid injection which gave her short term relief; also was referred to discuss surgical intervention which she currently declines. She denies RLS or mvt sxs. She has never been a good sleeper. She did try tramadol for pain control: however it kept her awake. She is on gabapentin now titrated up to 300mg  nightly which she believes helps some. Melatonin and trazadone have been used but melatonin is not helpful and trazadone gave her unacceptable side effects. She cannot afford belsomra. She rarely uses [place] and it helps her. She does not have sleep apnea.  . Female genital prolapse: reviewed recent GYN eval: discussed pessary vs surgical intervention. Pt has been scheduled in June  to see uro-gyn for further eval.  . ROS + hot flushes . HM: due for CPE with labs. And dexa.   Assessment  1. Chronic insomnia   2. PLMD (periodic limb movement disorder)   3. Spinal stenosis of lumbar region with neurogenic claudication   4. Spondylosis of lumbar region without myelopathy or radiculopathy   5. Female genital prolapse, unspecified type      Plan   Chronic insomnia:  Multifactorial. Discussed chronicity of problem and realistic expectations. Pain control is now the issue; continue gabapentin and add tylenol nighlty. To f/u with [facility] for further treatment recs. Prn Lorrin Mais is fine.   djd and spinal stenosis: per ortho/NS. Gabapentin. Can't push dose higher due to grogginess in am and feels unsteady on her feet  Bladder prolapse: to uro gyn.   I discussed the assessment and treatment plan with the patient. The patient was provided an opportunity to ask questions and all were answered. The patient agreed with the plan and demonstrated an understanding of the instructions.   The patient was advised to call back or seek an in-person evaluation if the symptoms worsen or if the condition fails to improve as anticipated. Follow up: cpe  03/20/2020  No orders of the defined types were placed in this encounter.     I reviewed the patients updated PMH, FH, and SocHx.    Patient Active Problem List   Diagnosis Date Noted  . Spinal stenosis of lumbar region - severe 12/18/2019    Priority: High  . DJD (degenerative joint disease), lumbar 08/20/2019  Priority: Medium  . Restless leg syndrome 11/06/2018    Priority: Medium  . Osteoarthritis of knee 11/03/2018    Priority: Medium  . Other idiopathic scoliosis, thoracolumbar region 11/03/2018    Priority: Medium  . PLMD (periodic limb movement disorder) 02/16/2016    Priority: Medium  . Gastroesophageal reflux disease without esophagitis 09/09/2015    Priority: Medium  . Primary osteoarthritis involving multiple  joints 09/09/2015    Priority: Medium  . Seasonal affective disorder ([place]) 09/09/2015    Priority: Medium  . Osteopenia 09/29/2014    Priority: Medium  . Chronic insomnia 07/30/2014    Priority: Medium  . Bladder prolapse, female, acquired 11/06/2018    Priority: Low  . Colon cancer screening- cologuard neg 2017 07/12/2017    Priority: Low  . No vaccination-pt refuse 07/12/2017    Priority: Low   Current Meds  Medication Sig  . BIOTIN PO Take 10,000 mcg by mouth daily.   . Calcium Carb-Cholecalciferol (CALCIUM-VITAMIN D3) 600-500 MG-UNIT CAPS Take by mouth.  . Cholecalciferol ([employer] VIT D 5000 HIGH-POTENCY PO) Take 1 tablet by mouth daily.   . Coenzyme Q-10 100 MG capsule Take 100 mg by mouth daily.   . Cranberry-Vit C-Lactobacillus (CRANBERRY/PROBIOTIC/VIT C PO) Take 2 tablets by mouth daily.   . Multiple Vitamin (MULTIVITAMIN) tablet Take 1 tablet by mouth.  . Omega-3 Fatty Acids (OMEGA-3 FISH OIL PO) Take 1,400 mg by mouth daily.  Marland Kitchen zolpidem (AMBIEN) 10 MG tablet Take 1 tablet (10 mg total) by mouth at bedtime as needed for sleep.  . [DISCONTINUED] gabapentin (NEURONTIN) 100 MG capsule Take 1 capsule (100 mg total) by mouth 3 (three) times daily.    Allergies: Patient is allergic to ether and requip [ropinirole hcl]. Family History: Patient family history includes Alcoholism in her mother; Breast cancer (age of onset: 75) in her sister; Depression in her mother; Diabetes in her daughter and father. Social History:  Patient  reports that she has never smoked. She has never used smokeless tobacco. She reports that she does not drink alcohol or use drugs.  Review of Systems: Constitutional: Negative for fever malaise or anorexia Cardiovascular: negative for chest pain Respiratory: negative for SOB or persistent cough Gastrointestinal: negative for abdominal pain  OBJECTIVE Vitals: There were no vitals taken for this visit. General: no acute distress , A&Ox3  Leamon Arnt,  MD

## 2020-03-20 ENCOUNTER — Other Ambulatory Visit: Payer: Self-pay

## 2020-03-20 ENCOUNTER — Ambulatory Visit (INDEPENDENT_AMBULATORY_CARE_PROVIDER_SITE_OTHER): Payer: Medicare Other | Admitting: Family Medicine

## 2020-03-20 ENCOUNTER — Telehealth: Payer: Self-pay | Admitting: Family Medicine

## 2020-03-20 ENCOUNTER — Encounter: Payer: Self-pay | Admitting: Family Medicine

## 2020-03-20 VITALS — BP 130/80 | HR 72 | Temp 97.3°F | Ht 63.0 in | Wt 136.4 lb

## 2020-03-20 DIAGNOSIS — K219 Gastro-esophageal reflux disease without esophagitis: Secondary | ICD-10-CM | POA: Diagnosis not present

## 2020-03-20 DIAGNOSIS — Z136 Encounter for screening for cardiovascular disorders: Secondary | ICD-10-CM

## 2020-03-20 DIAGNOSIS — Z780 Asymptomatic menopausal state: Secondary | ICD-10-CM

## 2020-03-20 DIAGNOSIS — F5104 Psychophysiologic insomnia: Secondary | ICD-10-CM | POA: Diagnosis not present

## 2020-03-20 DIAGNOSIS — N8110 Cystocele, unspecified: Secondary | ICD-10-CM

## 2020-03-20 DIAGNOSIS — M48062 Spinal stenosis, lumbar region with neurogenic claudication: Secondary | ICD-10-CM

## 2020-03-20 DIAGNOSIS — M47816 Spondylosis without myelopathy or radiculopathy, lumbar region: Secondary | ICD-10-CM | POA: Diagnosis not present

## 2020-03-20 DIAGNOSIS — Z1231 Encounter for screening mammogram for malignant neoplasm of breast: Secondary | ICD-10-CM

## 2020-03-20 DIAGNOSIS — M8589 Other specified disorders of bone density and structure, multiple sites: Secondary | ICD-10-CM

## 2020-03-20 LAB — CBC WITH DIFFERENTIAL/PLATELET
Absolute Monocytes: 469 cells/uL (ref 200–950)
Basophils Absolute: 57 cells/uL (ref 0–200)
Basophils Relative: 0.8 %
Eosinophils Absolute: 149 cells/uL (ref 15–500)
Eosinophils Relative: 2.1 %
HCT: 43.7 % (ref 35.0–45.0)
Hemoglobin: 14.3 g/dL (ref 11.7–15.5)
Lymphs Abs: 2322 cells/uL (ref 850–3900)
MCH: 30 pg (ref 27.0–33.0)
MCHC: 32.7 g/dL (ref 32.0–36.0)
MCV: 91.8 fL (ref 80.0–100.0)
MPV: 11.2 fL (ref 7.5–12.5)
Monocytes Relative: 6.6 %
Neutro Abs: 4104 cells/uL (ref 1500–7800)
Neutrophils Relative %: 57.8 %
Platelets: 211 10*3/uL (ref 140–400)
RBC: 4.76 10*6/uL (ref 3.80–5.10)
RDW: 11.7 % (ref 11.0–15.0)
Total Lymphocyte: 32.7 %
WBC: 7.1 10*3/uL (ref 3.8–10.8)

## 2020-03-20 LAB — LIPID PANEL
Cholesterol: 140 mg/dL (ref <200)
HDL: 70 mg/dL (ref >=50)
LDL Cholesterol (Calc): 55 mg/dL (calc)
Non-HDL Cholesterol (Calc): 70 mg/dL (calc) (ref <130)
Total CHOL/HDL Ratio: 2 (calc) (ref <5.0)
Triglycerides: 70 mg/dL (ref <150)

## 2020-03-20 LAB — COMPREHENSIVE METABOLIC PANEL
AG Ratio: 1.8 (calc) (ref 1.0–2.5)
ALT: 14 U/L (ref 6–29)
AST: 22 U/L (ref 10–35)
Albumin: 4.3 g/dL (ref 3.6–5.1)
Alkaline phosphatase (APISO): 78 U/L (ref 37–153)
BUN: 16 mg/dL (ref 7–25)
CO2: 31 mmol/L (ref 20–32)
Calcium: 9.4 mg/dL (ref 8.6–10.4)
Chloride: 104 mmol/L (ref 98–110)
Creat: 0.65 mg/dL (ref 0.60–0.93)
Globulin: 2.4 g/dL (calc) (ref 1.9–3.7)
Glucose, Bld: 85 mg/dL (ref 65–99)
Potassium: 4.2 mmol/L (ref 3.5–5.3)
Sodium: 140 mmol/L (ref 135–146)
Total Bilirubin: 0.9 mg/dL (ref 0.2–1.2)
Total Protein: 6.7 g/dL (ref 6.1–8.1)

## 2020-03-20 MED ORDER — GABAPENTIN 300 MG PO CAPS: 300.0000 mg | ORAL_CAPSULE | Freq: Every day | ORAL | 3 refills | Status: DC

## 2020-03-20 NOTE — Telephone Encounter (Signed)
error 

## 2020-03-20 NOTE — Progress Notes (Signed)
Subjective  Chief Complaint  Patient presents with  . Annual Exam    non-fasting  . Back Pain    appt with Dr. Ellene Route in 4 weeks to discuss surgery    HPI: Diane Murphy is a 74 y.o. female who presents to [facility] today for a Female Wellness Visit. She also has the concerns and/or needs as listed above in the chief complaint. These will be addressed in addition to the Health Maintenance Visit.   Wellness Visit: annual visit with health maintenance review and exam without Pap   Health maintenance: Due mammogram and bone density screening/follow-up osteopenia.  Refuses immunizations.  Chronic disease f/u and/or acute problem visit: (deemed necessary to be done in addition to the wellness visit):  Spinal stenosis and chronic back pain interrupting sleep with chronic insomnia: Tylenol and Neurontin continue to help with as needed Ambien.  No new symptoms.  She has been referred to neurosurgery to assess whether she is a surgical candidate.  GERD is well controlled.  Pelvic organ prolapse: Reviewed urogynecology notes.  At this time patient is behaviorally managing her symptoms.  She declines pessary, or medications.  No changes in symptoms.  Mild nocturia present.  No recent dysuria.  Assessment  1. Spinal stenosis of lumbar region with neurogenic claudication   2. Chronic insomnia   3. Spondylosis of lumbar region without myelopathy or radiculopathy   4. Gastroesophageal reflux disease without esophagitis   5. Osteopenia of multiple sites   6. Bladder prolapse, female, acquired   7. Screening, ischemic heart disease   8. Encounter for screening mammogram for breast cancer   9. Asymptomatic menopausal state      Plan  Female Wellness Visit:  Age appropriate Health Maintenance and Prevention measures were discussed with patient. Included topics are cancer screening recommendations, ways to keep healthy (see AVS) including dietary and exercise  recommendations, regular eye and dental care, use of seat belts, and avoidance of moderate alcohol use and tobacco use.   BMI: discussed patient's BMI and encouraged positive lifestyle modifications to help get to or maintain a target BMI.  HM needs and immunizations were addressed and ordered. See below for orders. See HM and immunization section for updates.  Routine labs and screening tests ordered including cmp, cbc and lipids where appropriate.  Discussed recommendations regarding Vit D and calcium supplementation (see AVS)  Chronic disease management visit and/or acute problem visit:  Chronic medical problems are well controlled.  Continue current medications.  Check lab work.  Mammogram and bone density ordered.  She requests a handicap placard due to her chronic pain and difficulty with walking long distances.  This was completed.   Follow up: Return in about 6 months (around 09/20/2020) for recheck.  Orders Placed This Encounter  Procedures  . MM DIGITAL SCREENING BILATERAL  . DG Bone Density  . CBC with Differential/Platelet  . Comprehensive metabolic panel  . Lipid panel   Meds ordered this encounter  Medications  . gabapentin (NEURONTIN) 300 MG capsule    Sig: Take 1 capsule (300 mg total) by mouth at bedtime.    Dispense:  90 capsule    Refill:  3    Please keep on file for next refill due.      Lifestyle: Body mass index is 24.16 kg/m. Wt Readings from Last 3 Encounters:  03/20/20 136 lb 6.4 oz (61.9 kg)  09/17/19 139 lb 6.4 oz (63.2 kg)  08/14/19 141 lb 12.8 oz (  64.3 kg)     Patient Active Problem List   Diagnosis Date Noted  . Spinal stenosis of lumbar region - severe 12/18/2019    Priority: High    MRI 2021: diffuse lumbar DJD and severe Lumbar stenosis: epidural steroid injection per Dr. Ron Agee, PT at [facility] recommended. Recommended to consider surgery but pt defers.   Marland Kitchen DJD (degenerative joint disease), lumbar 08/20/2019    Priority: Medium    MRI  lumbar 2021   . Restless leg syndrome 11/06/2018    Priority: Medium  . Osteoarthritis of knee 11/03/2018    Priority: Medium  . Other idiopathic scoliosis, thoracolumbar region 11/03/2018    Priority: Medium  . PLMD (periodic limb movement disorder) 02/16/2016    Priority: Medium    No OSA but does have mild PLMD on sleep study from 02/2016; also recommending sleeping in a cooler environment   . Gastroesophageal reflux disease without esophagitis 09/09/2015    Priority: Medium  . Primary osteoarthritis involving multiple joints 09/09/2015    Priority: Medium  . Seasonal affective disorder ([place]) 09/09/2015    Priority: Medium  . Osteopenia 09/29/2014    Priority: Medium    Dexa 11/2017 - T = -1.6 lowest. FRAX score 2.9%; consider therapy vs rechecking in 1 year.    . Chronic insomnia 07/30/2014    Priority: Medium  . Bladder prolapse, female, acquired 11/06/2018    Priority: Low  . Colon cancer screening- cologuard neg 2017 07/12/2017    Priority: Low  . No vaccination-pt refuse 07/12/2017    Priority: Low   Health Maintenance  Topic Date Due  . DEXA SCAN  11/17/2019  . MAMMOGRAM  02/07/2020  . Fecal DNA (Cologuard)  07/19/2022  . COVID-19 Vaccine  Completed  . Hepatitis C Screening  Completed   Immunization History  Administered Date(s) Administered  . Moderna SARS-COVID-2 Vaccination 08/31/2019, 10/01/2019   We updated and reviewed the patient's past history in detail and it is documented below. Allergies: Patient is allergic to ether and requip [ropinirole hcl]. Past Medical History Patient  has a past medical history of Arthritis, Cystocele, grade 3 (01/2013), Depression, GERD (gastroesophageal reflux disease), and Osteopenia. Past Surgical History Patient  has a past surgical history that includes Tonsillectomy and adenoidectomy; Cholecystectomy (1999); right elbow (1970); and Cystocele repair (2014). Family History: Patient family history includes Alcoholism in  her mother; Breast cancer (age of onset: 24) in her sister; Depression in her mother; Diabetes in her daughter and father. Social History:  Patient  reports that she has never smoked. She has never used smokeless tobacco. She reports that she does not drink alcohol and does not use drugs.  Review of Systems: Constitutional: negative for fever or malaise Ophthalmic: negative for photophobia, double vision or loss of vision Cardiovascular: negative for chest pain, dyspnea on exertion, or new LE swelling Respiratory: negative for SOB or persistent cough Gastrointestinal: negative for abdominal pain, change in bowel habits or melena Genitourinary: negative for dysuria or gross hematuria, no abnormal uterine bleeding or disharge Musculoskeletal: negative for new gait disturbance or muscular weakness Integumentary: negative for new or persistent rashes, no breast lumps Neurological: negative for TIA or stroke symptoms Psychiatric: negative for SI or delusions Allergic/Immunologic: negative for hives  Patient Care Team    Relationship Specialty Notifications Start End  Leamon Arnt, MD PCP - General Family Medicine  05/24/19   Almedia Balls, MD Referring Physician Orthopedic Surgery  07/12/17   Verner Chol, MD Consulting Physician Sports Medicine  08/20/19   Warden Fillers, MD Consulting Physician Ophthalmology  11/29/19   Marti Sleigh, MD Consulting Physician Gynecology  11/29/19   Kristeen Miss, MD Consulting Physician Neurosurgery  03/20/20     Objective  Vitals: BP 130/80   Pulse 72   Temp (!) 97.3 F (36.3 C) (Temporal)   Ht 5\' 3"  (1.6 m)   Wt 136 lb 6.4 oz (61.9 kg)   SpO2 97%   BMI 24.16 kg/m  General:  Well developed, well nourished, no acute distress  Psych:  Alert and orientedx3,normal mood and affect HEENT:  Normocephalic, atraumatic, non-icteric sclera,  supple neck without adenopathy, mass or thyromegaly Cardiovascular:  Normal S1, S2, RRR without  gallop, rub or murmur Respiratory:  Good breath sounds bilaterally, CTAB with normal respiratory effort Gastrointestinal: normal bowel sounds, soft, non-tender, no noted masses. No HSM MSK: no deformities, contusions. Joints are without erythema or swelling.  Skin:  Warm, no rashes or suspicious lesions noted Neurologic:    Mental status is normal. CN 2-11 are normal. Gross motor and sensory exams are normal. Normal gait. No tremor Breast Exam: No mass, skin retraction or nipple discharge is appreciated in either breast. No axillary adenopathy. Fibrocystic changes are not noted    Commons side effects, risks, benefits, and alternatives for medications and treatment plan prescribed today were discussed, and the patient expressed understanding of the given instructions. Patient is instructed to call or message via MyChart if he/she has any questions or concerns regarding our treatment plan. No barriers to understanding were identified. We discussed Red Flag symptoms and signs in detail. Patient expressed understanding regarding what to do in case of urgent or emergency type symptoms.   Medication list was reconciled, printed and provided to the patient in AVS. Patient instructions and summary information was reviewed with the patient as documented in the AVS. This note was prepared with assistance of Dragon voice recognition software. Occasional wrong-word or sound-a-like substitutions may have occurred due to the inherent limitations of voice recognition software  This visit occurred during the SARS-CoV-2 public health emergency.  Safety protocols were in place, including screening questions prior to the visit, additional usage of staff PPE, and extensive cleaning of exam room while observing appropriate contact time as indicated for disinfecting solutions.

## 2020-03-20 NOTE — Patient Instructions (Signed)
Please return in 6 months for recheck.   If you have any questions or concerns, please don't hesitate to send me a message via MyChart or call the office at [phone]. Thank you for visiting with [place] today! It's our pleasure caring for you.  I have ordered a mammogram and/or bone density for you as we discussed today: [x]   Mammogram  [x]   Bone Density  Please call the office checked below to schedule your appointment: Your appointment will at the following location  [x]   [facility]      [place], [place]         []   [facility]  [address] [place], [place]

## 2020-03-21 ENCOUNTER — Telehealth: Payer: Self-pay

## 2020-03-21 NOTE — Telephone Encounter (Signed)
Yes, please place on my desk and we can mail it to her. I forgot to do this yesterday

## 2020-03-21 NOTE — Telephone Encounter (Signed)
Spoke with the patient about her labs and she would like to know would you be willing to renew her handicap form. Please advise

## 2020-03-21 NOTE — Telephone Encounter (Signed)
Form has been filled out and placed on Dr. Tamela Oddi for her to sign. I will mail to the patient once it has been signed.

## 2020-03-21 NOTE — Progress Notes (Signed)
Please call patient: I have reviewed his/her lab results. Her lab results are perfect!

## 2020-03-27 ENCOUNTER — Other Ambulatory Visit: Payer: Self-pay | Admitting: Family Medicine

## 2020-03-28 ENCOUNTER — Other Ambulatory Visit: Payer: Self-pay | Admitting: Family Medicine

## 2020-03-28 DIAGNOSIS — Z780 Asymptomatic menopausal state: Secondary | ICD-10-CM

## 2020-03-28 DIAGNOSIS — M8589 Other specified disorders of bone density and structure, multiple sites: Secondary | ICD-10-CM

## 2020-04-03 ENCOUNTER — Ambulatory Visit
Admission: RE | Admit: 2020-04-03 | Discharge: 2020-04-03 | Disposition: A | Discharge status: Home / Self Care | Payer: Medicare Other | Source: Ambulatory Visit | Attending: Family Medicine | Admitting: Family Medicine

## 2020-04-03 ENCOUNTER — Other Ambulatory Visit: Payer: Self-pay

## 2020-04-03 DIAGNOSIS — Z1231 Encounter for screening mammogram for malignant neoplasm of breast: Secondary | ICD-10-CM

## 2020-05-26 ENCOUNTER — Telehealth: Payer: Self-pay

## 2020-05-26 ENCOUNTER — Other Ambulatory Visit: Payer: Self-pay

## 2020-05-26 DIAGNOSIS — N8110 Cystocele, unspecified: Secondary | ICD-10-CM

## 2020-05-26 NOTE — Telephone Encounter (Signed)
Referral placed.

## 2020-05-26 NOTE — Telephone Encounter (Signed)
Pt called asking if Dr. Jonni Sanger could put a new referral in for her to urology. Pt is wanting to see Dr. Sherryll Burger in [place]. Office fax is [phone]. Please advise.

## 2020-05-26 NOTE — Telephone Encounter (Signed)
yes

## 2020-05-26 NOTE — Telephone Encounter (Signed)
OK for new referral?

## 2020-07-08 ENCOUNTER — Other Ambulatory Visit: Payer: Self-pay

## 2020-07-08 ENCOUNTER — Ambulatory Visit
Admission: RE | Admit: 2020-07-08 | Discharge: 2020-07-08 | Disposition: A | Discharge status: Home / Self Care | Payer: Medicare Other | Source: Ambulatory Visit | Attending: Family Medicine | Admitting: Family Medicine

## 2020-07-08 DIAGNOSIS — Z780 Asymptomatic menopausal state: Secondary | ICD-10-CM

## 2020-07-08 DIAGNOSIS — M8589 Other specified disorders of bone density and structure, multiple sites: Secondary | ICD-10-CM

## 2020-07-09 NOTE — Progress Notes (Signed)
Please call patient: I have reviewed his/her lab results. Bone density results are stable showing osteopenia or mild low bone mass. Continue vit D, ca and weight bearing exercise. We will recheck in 2 years.   Dexa 06/2020 lowest T = - 1.8, femur; moderately elevated FRAX scores but does not yet meet criteria for treatment. Recheck 2 years. Continue Vit D and Ca and weight bearing exercise.

## 2020-09-22 DIAGNOSIS — N390 Urinary tract infection, site not specified: Secondary | ICD-10-CM | POA: Diagnosis not present

## 2020-09-23 ENCOUNTER — Ambulatory Visit (INDEPENDENT_AMBULATORY_CARE_PROVIDER_SITE_OTHER): Payer: Medicare Other | Admitting: Family Medicine

## 2020-09-23 ENCOUNTER — Telehealth: Payer: Self-pay

## 2020-09-23 ENCOUNTER — Other Ambulatory Visit: Payer: Self-pay

## 2020-09-23 ENCOUNTER — Encounter: Payer: Self-pay | Admitting: Family Medicine

## 2020-09-23 VITALS — BP 128/72 | HR 70 | Temp 97.6°F | Resp 18 | Ht 63.0 in | Wt 145.0 lb

## 2020-09-23 DIAGNOSIS — M8589 Other specified disorders of bone density and structure, multiple sites: Secondary | ICD-10-CM | POA: Diagnosis not present

## 2020-09-23 DIAGNOSIS — N8110 Cystocele, unspecified: Secondary | ICD-10-CM | POA: Diagnosis not present

## 2020-09-23 DIAGNOSIS — F338 Other recurrent depressive disorders: Secondary | ICD-10-CM | POA: Diagnosis not present

## 2020-09-23 DIAGNOSIS — Z2821 Immunization not carried out because of patient refusal: Secondary | ICD-10-CM | POA: Diagnosis not present

## 2020-09-23 DIAGNOSIS — F5104 Psychophysiologic insomnia: Secondary | ICD-10-CM

## 2020-09-23 DIAGNOSIS — M419 Scoliosis, unspecified: Secondary | ICD-10-CM | POA: Insufficient documentation

## 2020-09-23 DIAGNOSIS — M48062 Spinal stenosis, lumbar region with neurogenic claudication: Secondary | ICD-10-CM

## 2020-09-23 MED ORDER — ZOLPIDEM TARTRATE 10 MG PO TABS: 10.0000 mg | ORAL_TABLET | Freq: Every evening | ORAL | 5 refills | Status: DC | PRN

## 2020-09-23 NOTE — Patient Instructions (Signed)
Please return in 6 months for your annual complete physical; please come fasting.  I have refilled your ambien. Try using cetaphil or lubriderm for your skin.   If you have any questions or concerns, please don't hesitate to send me a message via MyChart or call the office at [phone]. Thank you for visiting with [place] today! It's our pleasure caring for you.  Glad you are doing well.

## 2020-09-23 NOTE — Telephone Encounter (Signed)
Patient wanted to see if she could get Dr. Jonni Sanger to write a letter saying she would benefit from doing the Fit feet Program. Since she does have back issues. Printed the paper off that the patient need filled out so she can get it done. Placed in Dr. Toy Baker box.

## 2020-09-23 NOTE — Progress Notes (Signed)
Subjective  CC:  Chief Complaint  Patient presents with  . Insomnia    Still having issues with falling asleep at night.     HPI: Diane Murphy is a 75 y.o. female who presents to the office today to address the problems listed above in the chief complaint.  75 year old with spinal stenosis followed by neurosurgery, deferring surgery for now.  Chronic insomnia with intermittent use of Ambien, osteopenia, recurrent bladder infections and bladder prolapse recently evaluated by urology.  Reviewed those notes.  Being referred to urogynecology for possible surgical repair.  She also has history of seasonal affective disorder that is not currently active.  Overall she is doing very well.  Spinal stenosis with claudication does limit her activity.  She requests a handicap placard.  This is reasonable.  She has no new symptoms.  Discussed chronic insomnia.  Ambien is the only thing that ever worked for her.  She is using it about once a week.  She understands risk versus benefits.  Other nights the week she is not sleeping well at all.  Mood is good.  Health maintenance: Screens are up-to-date.  She refuses vaccinations.  However she did get the Covid vaccine  Assessment  1. Spinal stenosis of lumbar region with neurogenic claudication   2. Chronic insomnia   3. Osteopenia of multiple sites   4. Bladder prolapse, female, acquired   5. Seasonal affective disorder ([place]) Chronic  6. No vaccination-pt refuse      Plan   Spinal stenosis with neurogenic claudication: Completed handicap placard.  Patient is also requesting a Rollator.  We will look into ordering this for her.  She stopped Neurontin as it was not helpful.  This is reasonable.  Chronic insomnia: Has worked on this for years.  She has failed multiple agents.  Continue Ambien 5 to 10 mg as needed.  Refill today.  Osteopenia: Recheck bone density next year.  Continue calcium and vitamin D.  She remains active.  Bladder prolapse  recurrent infections per urology.  Seasonal affective disorder, mild.  No needs identified at this time.  Follow up: 6 months for complete physical Visit date not found  No orders of the defined types were placed in this encounter.   Meds ordered this encounter  Medications  . zolpidem (AMBIEN) 10 MG tablet    Sig: Take 1 tablet (10 mg total) by mouth at bedtime as needed for sleep.    Dispense:  30 tablet    Refill:  5      I reviewed the patients updated PMH, FH, and SocHx.    Patient Active Problem List   Diagnosis Date Noted  . Lumbar stenosis with neurogenic claudication 12/18/2019    Priority: High  . DJD (degenerative joint disease), lumbar 08/20/2019    Priority: Medium  . Restless leg syndrome 11/06/2018    Priority: Medium  . Osteoarthritis of knee 11/03/2018    Priority: Medium  . Other idiopathic scoliosis, thoracolumbar region 11/03/2018    Priority: Medium  . PLMD (periodic limb movement disorder) 02/16/2016    Priority: Medium  . Gastroesophageal reflux disease without esophagitis 09/09/2015    Priority: Medium  . Primary osteoarthritis involving multiple joints 09/09/2015    Priority: Medium  . Seasonal affective disorder ([place]) 09/09/2015    Priority: Medium  . Osteopenia 09/29/2014    Priority: Medium  . Chronic insomnia 07/30/2014    Priority: Medium  . Bladder prolapse, female, acquired 11/06/2018    Priority: Low  .  Colon cancer screening- cologuard neg 2017 07/12/2017    Priority: Low  . No vaccination-pt refuse 07/12/2017    Priority: Low  . Scoliosis deformity of spine 09/23/2020   Current Meds  Medication Sig  . BIOTIN PO Take 10,000 mcg by mouth daily.   . Calcium Carb-Cholecalciferol (CALCIUM-VITAMIN D3) 600-500 MG-UNIT CAPS Take by mouth.  . Cholecalciferol ([employer] VIT D 5000 HIGH-POTENCY PO) Take 1 tablet by mouth daily.   . Coenzyme Q-10 100 MG capsule Take 100 mg by mouth daily.   . Cranberry-Vit C-Lactobacillus  (CRANBERRY/PROBIOTIC/VIT C PO) Take 2 tablets by mouth daily.   Marland Kitchen estradiol (ESTRACE) 0.1 MG/GM vaginal cream Place 1 Applicatorful vaginally 3 (three) times a week.  . Multiple Vitamin (MULTIVITAMIN) tablet Take 1 tablet by mouth.  . nitrofurantoin (MACRODANTIN) 100 MG capsule Take 100 mg by mouth in the morning and at bedtime. X 7 days  . Omega-3 Fatty Acids (OMEGA-3 FISH OIL PO) Take 1,400 mg by mouth daily.    Allergies: Patient is allergic to ether and requip [ropinirole hcl]. Family History: Patient family history includes Alcoholism in her mother; Breast cancer (age of onset: 52) in her sister; Depression in her mother; Diabetes in her daughter and father. Social History:  Patient  reports that she has never smoked. She has never used smokeless tobacco. She reports that she does not drink alcohol and does not use drugs.  Review of Systems: Constitutional: Negative for fever malaise or anorexia Cardiovascular: negative for chest pain Respiratory: negative for SOB or persistent cough Gastrointestinal: negative for abdominal pain  Objective  Vitals: BP 128/72   Pulse 70   Temp 97.6 F (36.4 C) (Temporal)   Resp 18   Ht 5\' 3"  (1.6 m)   Wt 145 lb (65.8 kg)   SpO2 98%   BMI 25.69 kg/m  General: no acute distress , A&Ox3 HEENT: PEERL, conjunctiva normal, neck is supple Cardiovascular:  RRR without murmur or gallop.  Respiratory:  Good breath sounds bilaterally, CTAB with normal respiratory effort Skin:  Warm, no rashes     Commons side effects, risks, benefits, and alternatives for medications and treatment plan prescribed today were discussed, and the patient expressed understanding of the given instructions. Patient is instructed to call or message via MyChart if he/she has any questions or concerns regarding our treatment plan. No barriers to understanding were identified. We discussed Red Flag symptoms and signs in detail. Patient expressed understanding regarding what to  do in case of urgent or emergency type symptoms.   Medication list was reconciled, printed and provided to the patient in AVS. Patient instructions and summary information was reviewed with the patient as documented in the AVS. This note was prepared with assistance of Dragon voice recognition software. Occasional wrong-word or sound-a-like substitutions may have occurred due to the inherent limitations of voice recognition software  This visit occurred during the SARS-CoV-2 public health emergency.  Safety protocols were in place, including screening questions prior to the visit, additional usage of staff PPE, and extensive cleaning of exam room while observing appropriate contact time as indicated for disinfecting solutions.

## 2020-09-24 NOTE — Telephone Encounter (Signed)
Paper placed in your sign bin

## 2020-09-24 NOTE — Telephone Encounter (Signed)
Please let her know I don't think she needs this

## 2020-09-25 NOTE — Telephone Encounter (Signed)
Any other recommendations? Please advise

## 2020-09-25 NOTE — Telephone Encounter (Signed)
Patient is stating she needs recommendation from dr in order to have this I tried to explain to patient dr.andy does not think she needs this and she was persistent.   Please advise ?

## 2020-09-26 NOTE — Telephone Encounter (Signed)
If you still have the sheet, I will change my recommendation.

## 2020-09-26 NOTE — Telephone Encounter (Signed)
I have given paperwork to [facility] who is going to call patient to verify if she wants letter mailed to her or faxed

## 2020-09-26 NOTE — Telephone Encounter (Signed)
Called patient she wanted the paper mailed. Sent paperwork in the mail today.

## 2020-11-11 DIAGNOSIS — B351 Tinea unguium: Secondary | ICD-10-CM | POA: Diagnosis not present

## 2020-11-11 DIAGNOSIS — M2011 Hallux valgus (acquired), right foot: Secondary | ICD-10-CM | POA: Diagnosis not present

## 2020-11-11 DIAGNOSIS — M21169 Varus deformity, not elsewhere classified, unspecified knee: Secondary | ICD-10-CM | POA: Diagnosis not present

## 2020-11-11 DIAGNOSIS — M2170 Unequal limb length (acquired), unspecified site: Secondary | ICD-10-CM | POA: Diagnosis not present

## 2020-11-11 DIAGNOSIS — M2012 Hallux valgus (acquired), left foot: Secondary | ICD-10-CM | POA: Diagnosis not present

## 2020-11-11 DIAGNOSIS — M21622 Bunionette of left foot: Secondary | ICD-10-CM | POA: Diagnosis not present

## 2020-11-11 DIAGNOSIS — M2141 Flat foot [pes planus] (acquired), right foot: Secondary | ICD-10-CM | POA: Diagnosis not present

## 2020-11-11 DIAGNOSIS — M2142 Flat foot [pes planus] (acquired), left foot: Secondary | ICD-10-CM | POA: Diagnosis not present

## 2020-11-11 DIAGNOSIS — M79672 Pain in left foot: Secondary | ICD-10-CM | POA: Diagnosis not present

## 2020-11-11 DIAGNOSIS — M79671 Pain in right foot: Secondary | ICD-10-CM | POA: Diagnosis not present

## 2020-11-11 DIAGNOSIS — M21621 Bunionette of right foot: Secondary | ICD-10-CM | POA: Diagnosis not present

## 2020-11-28 DIAGNOSIS — H1045 Other chronic allergic conjunctivitis: Secondary | ICD-10-CM | POA: Diagnosis not present

## 2020-11-28 DIAGNOSIS — H04123 Dry eye syndrome of bilateral lacrimal glands: Secondary | ICD-10-CM | POA: Diagnosis not present

## 2020-11-28 DIAGNOSIS — H2513 Age-related nuclear cataract, bilateral: Secondary | ICD-10-CM | POA: Diagnosis not present

## 2020-12-03 ENCOUNTER — Telehealth: Payer: Self-pay | Admitting: Family Medicine

## 2020-12-03 NOTE — Telephone Encounter (Signed)
Left message for patient to call back and schedule Medicare Annual Wellness Visit (AWV) either virtually or in office.   Last AWV 11/29/19  please schedule at anytime with health coach  This should be a 45 minute visit.

## 2020-12-11 IMAGING — MG DIGITAL SCREENING BILAT W/ TOMO W/ CAD
8 series · 8 of 24 positions shown · non-contrast
Comparison: Previous exam(s).

CLINICAL DATA: Screening.

EXAM:
DIGITAL SCREENING BILATERAL MAMMOGRAM WITH TOMO AND CAD

[L MLO synth-2D]
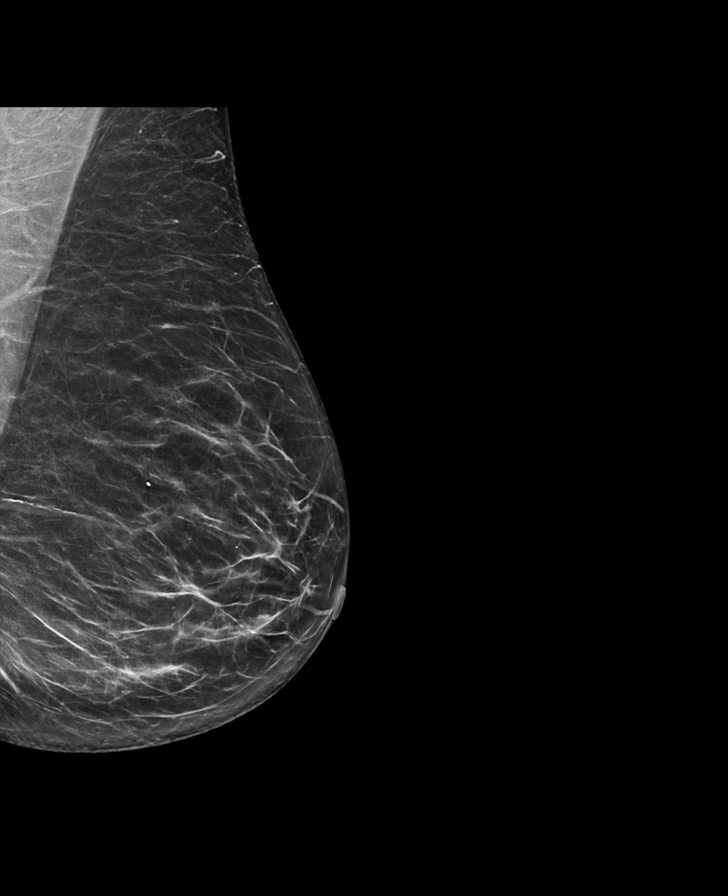

[R MLO synth-2D]
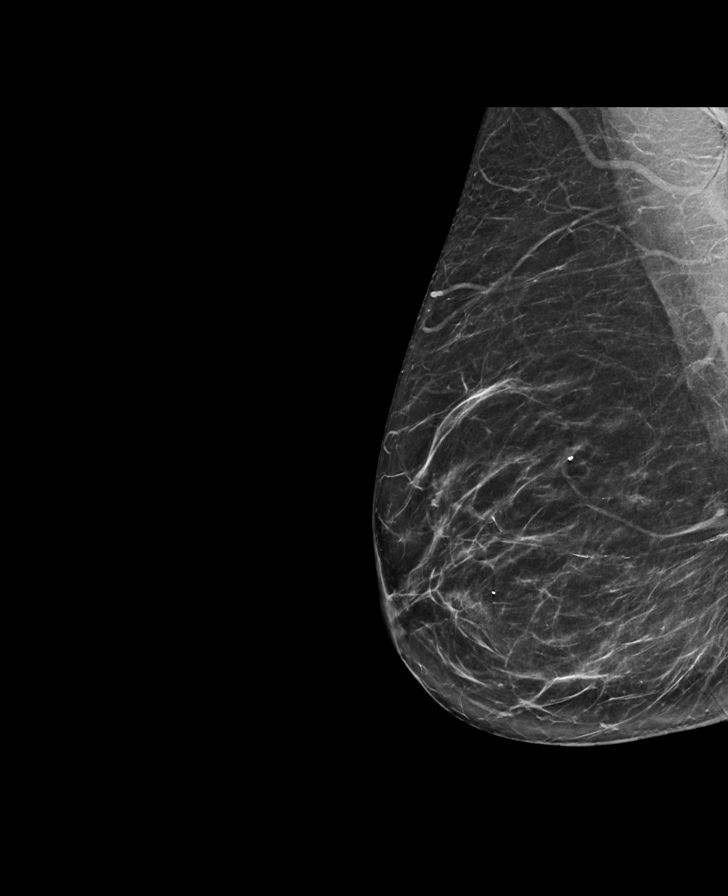

[L CC synth-2D]
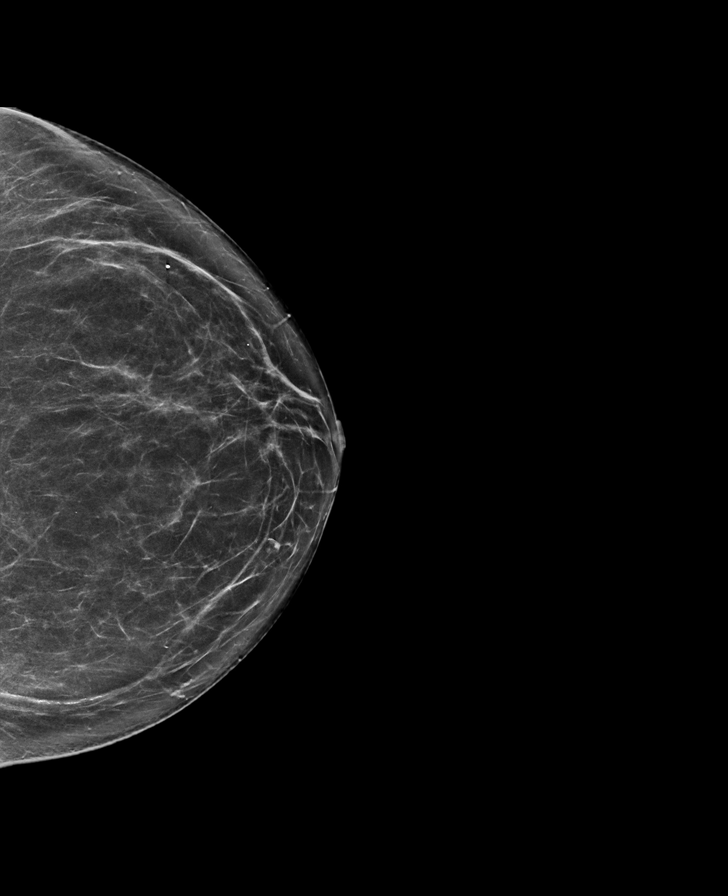

[R CC synth-2D]
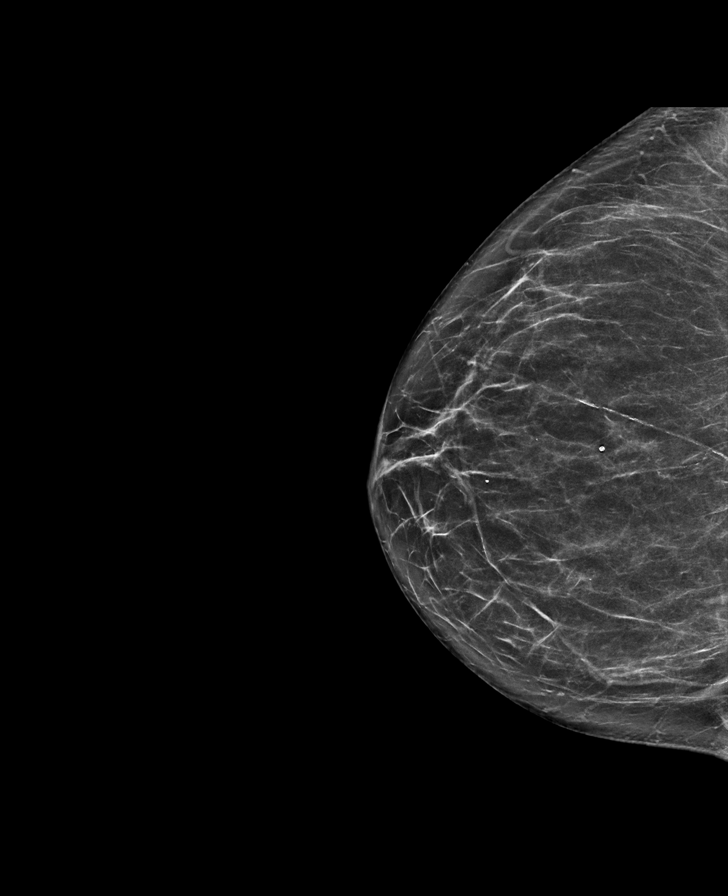

[R MLO tomo · tomo slice 37/72.0]
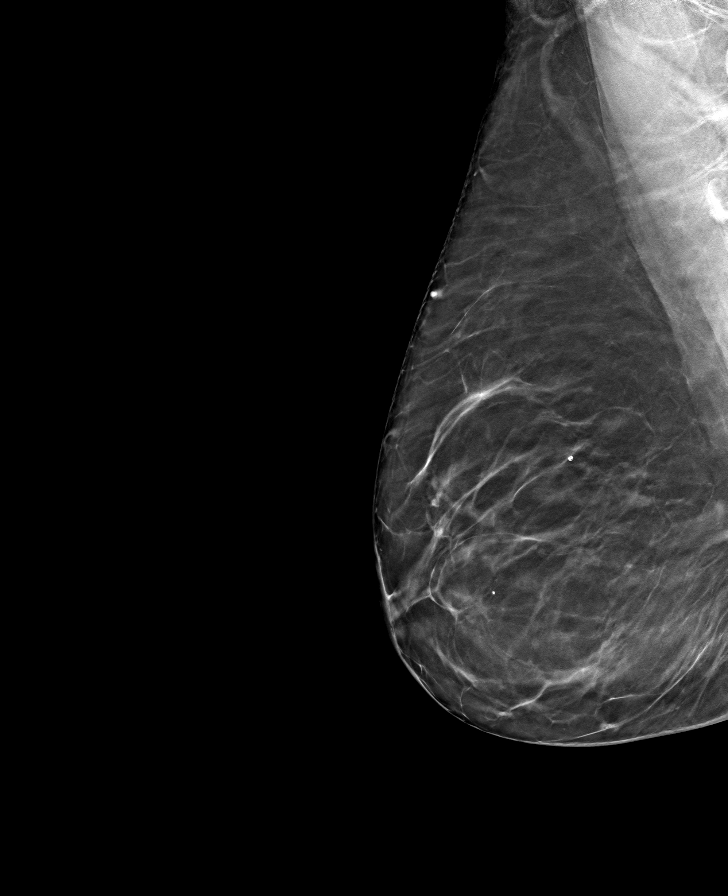

[L CC tomo · tomo slice 37/72.0]
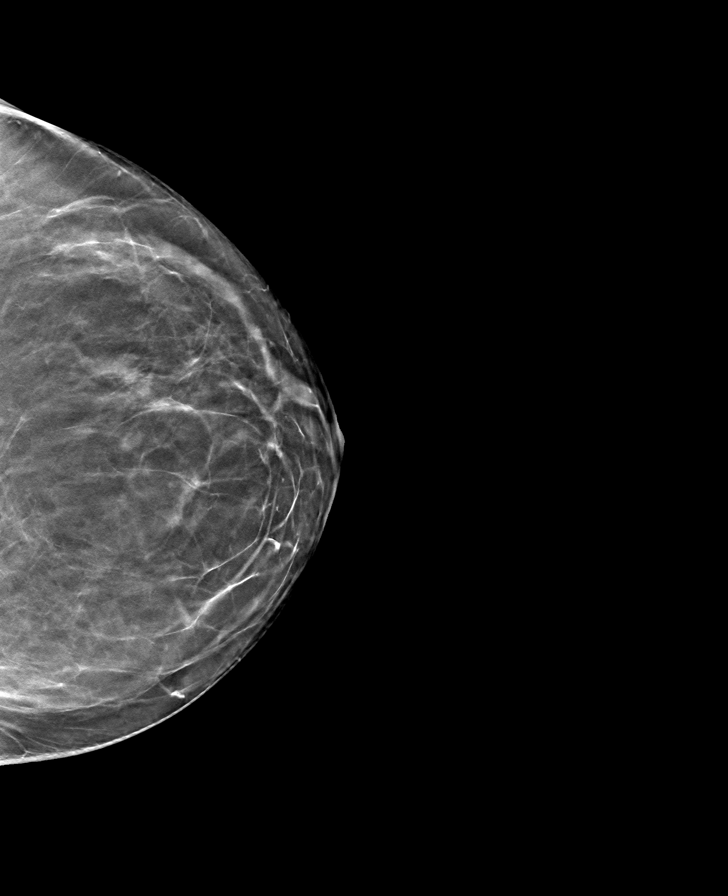

[L MLO tomo · tomo slice 37/73.0]
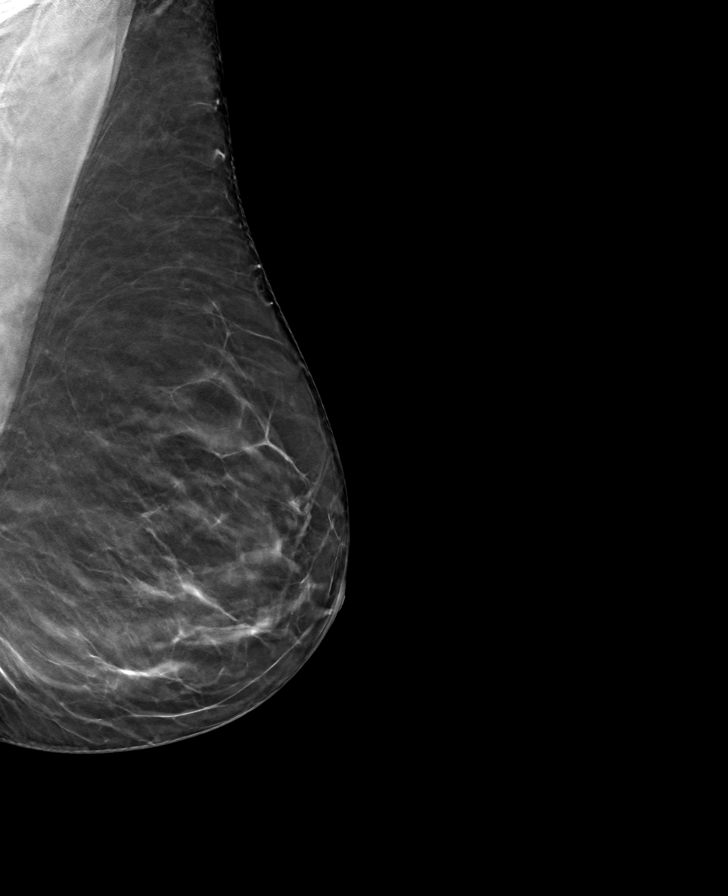

[R CC tomo · tomo slice 35/70.0]
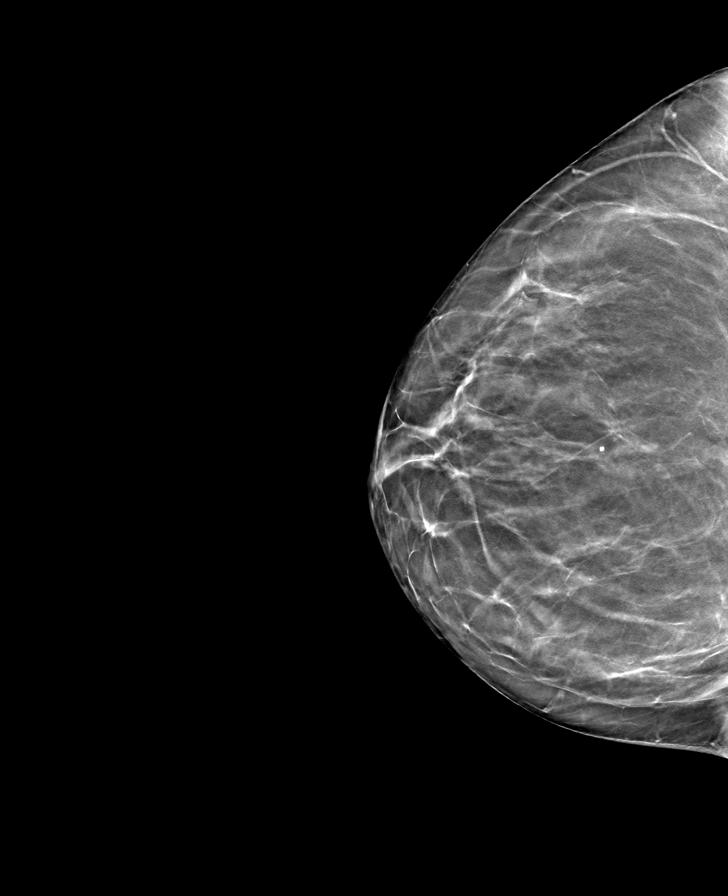

[8 of 24 positions shown; findings below may reference images not displayed]

ACR Breast Density Category b: There are scattered areas of
fibroglandular density.
FINDINGS: There are no findings suspicious for malignancy. Images were
processed with CAD.
IMPRESSION: No mammographic evidence of malignancy. A result letter of this
screening mammogram will be mailed directly to the patient.

RECOMMENDATION:
Screening mammogram in one year. (Code:[id number])

BI-RADS CATEGORY  1: Negative.

## 2020-12-12 DIAGNOSIS — Z23 Encounter for immunization: Secondary | ICD-10-CM | POA: Diagnosis not present

## 2020-12-19 DIAGNOSIS — T50B95A Adverse effect of other viral vaccines, initial encounter: Secondary | ICD-10-CM | POA: Diagnosis not present

## 2020-12-19 DIAGNOSIS — R52 Pain, unspecified: Secondary | ICD-10-CM | POA: Diagnosis not present

## 2020-12-22 ENCOUNTER — Ambulatory Visit (INDEPENDENT_AMBULATORY_CARE_PROVIDER_SITE_OTHER): Payer: Medicare Other

## 2020-12-22 ENCOUNTER — Other Ambulatory Visit: Payer: Self-pay

## 2020-12-22 VITALS — BP 120/80 | HR 78 | Temp 97.2°F | Wt 133.4 lb

## 2020-12-22 DIAGNOSIS — H1045 Other chronic allergic conjunctivitis: Secondary | ICD-10-CM | POA: Diagnosis not present

## 2020-12-22 DIAGNOSIS — Z0000 Encounter for general adult medical examination without abnormal findings: Secondary | ICD-10-CM

## 2020-12-22 DIAGNOSIS — H2513 Age-related nuclear cataract, bilateral: Secondary | ICD-10-CM | POA: Diagnosis not present

## 2020-12-22 DIAGNOSIS — H04123 Dry eye syndrome of bilateral lacrimal glands: Secondary | ICD-10-CM | POA: Diagnosis not present
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
## 2020-12-22 NOTE — Patient Instructions (Signed)
Diane Murphy , Thank you for taking time to come for your Medicare Wellness Visit. I appreciate your ongoing commitment to your health goals. Please review the following plan we discussed and let me know if I can assist you in the future.   Screening recommendations/referrals: Colonoscopy: Pt stated completed in 2021 cologuard  Mammogram: Done 04/03/20 Bone Density: Done 07/08/20 Recommended yearly ophthalmology/optometry visit for glaucoma screening and checkup Recommended yearly dental visit for hygiene and checkup  Vaccinations: Shingles vaccine: Shingrix discussed. Please contact your pharmacy for coverage information.    Covid-19:Completed 1/22, 2/22, & 07/14/20  Advanced directives: Advance directive discussed with you today. I have provided a copy for you to complete at home and have notarized. Once this is complete please bring a copy in to our office so we can scan it into your chart.  Conditions/risks identified: Get body firm again  Next appointment: Follow up in one year for your annual wellness visit    Preventive Care 65 Years and Older, Female Preventive care refers to lifestyle choices and visits with your health care provider that can promote health and wellness. What does preventive care include?  A yearly physical exam. This is also called an annual well check.  Dental exams once or twice a year.  Routine eye exams. Ask your health care provider how often you should have your eyes checked.  Personal lifestyle choices, including:  Daily care of your teeth and gums.  Regular physical activity.  Eating a healthy diet.  Avoiding tobacco and drug use.  Limiting alcohol use.  Practicing safe sex.  Taking low-dose aspirin every day.  Taking vitamin and mineral supplements as recommended by your health care provider. What happens during an annual well check? The services and screenings done by your health care provider during your annual well check will depend on  your age, overall health, lifestyle risk factors, and family history of disease. Counseling  Your health care provider may ask you questions about your:  Alcohol use.  Tobacco use.  Drug use.  Emotional well-being.  Home and relationship well-being.  Sexual activity.  Eating habits.  History of falls.  Memory and ability to understand (cognition).  Work and work Statistician.  Reproductive health. Screening  You may have the following tests or measurements:  Height, weight, and BMI.  Blood pressure.  Lipid and cholesterol levels. These may be checked every 5 years, or more frequently if you are over 74 years old.  Skin check.  Lung cancer screening. You may have this screening every year starting at age 58 if you have a 30-pack-year history of smoking and currently smoke or have quit within the past 15 years.  Fecal occult blood test (FOBT) of the stool. You may have this test every year starting at age 43.  Flexible sigmoidoscopy or colonoscopy. You may have a sigmoidoscopy every 5 years or a colonoscopy every 10 years starting at age 66.  Hepatitis C blood test.  Hepatitis B blood test.  Sexually transmitted disease (STD) testing.  Diabetes screening. This is done by checking your blood sugar (glucose) after you have not eaten for a while (fasting). You may have this done every 1-3 years.  Bone density scan. This is done to screen for osteoporosis. You may have this done starting at age 73.  Mammogram. This may be done every 1-2 years. Talk to your health care provider about how often you should have regular mammograms. Talk with your health care provider about your test results,  treatment options, and if necessary, the need for more tests. Vaccines  Your health care provider may recommend certain vaccines, such as:  Influenza vaccine. This is recommended every year.  Tetanus, diphtheria, and acellular pertussis (Tdap, Td) vaccine. You may need a Td booster  every 10 years.  Zoster vaccine. You may need this after age 38.  Pneumococcal 13-valent conjugate (PCV13) vaccine. One dose is recommended after age 11.  Pneumococcal polysaccharide (PPSV23) vaccine. One dose is recommended after age 61. Talk to your health care provider about which screenings and vaccines you need and how often you need them. This information is not intended to replace advice given to you by your health care provider. Make sure you discuss any questions you have with your health care provider. Document Released: 08/22/2015 Document Revised: 04/14/2016 Document Reviewed: 05/27/2015 Elsevier Interactive Patient Education  2017 [place] Prevention in the Home Falls can cause injuries. They can happen to people of all ages. There are many things you can do to make your home safe and to help prevent falls. What can I do on the outside of my home?  Regularly fix the edges of walkways and driveways and fix any cracks.  Remove anything that might make you trip as you walk through a door, such as a raised step or threshold.  Trim any bushes or trees on the path to your home.  Use bright outdoor lighting.  Clear any walking paths of anything that might make someone trip, such as rocks or tools.  Regularly check to see if handrails are loose or broken. Make sure that both sides of any steps have handrails.  Any raised decks and porches should have guardrails on the edges.  Have any leaves, snow, or ice cleared regularly.  Use sand or salt on walking paths during winter.  Clean up any spills in your garage right away. This includes oil or grease spills. What can I do in the bathroom?  Use night lights.  Install grab bars by the toilet and in the tub and shower. Do not use towel bars as grab bars.  Use non-skid mats or decals in the tub or shower.  If you need to sit down in the shower, use a plastic, non-slip stool.  Keep the floor dry. Clean up any  water that spills on the floor as soon as it happens.  Remove soap buildup in the tub or shower regularly.  Attach bath mats securely with double-sided non-slip rug tape.  Do not have throw rugs and other things on the floor that can make you trip. What can I do in the bedroom?  Use night lights.  Make sure that you have a light by your bed that is easy to reach.  Do not use any sheets or blankets that are too big for your bed. They should not hang down onto the floor.  Have a firm chair that has side arms. You can use this for support while you get dressed.  Do not have throw rugs and other things on the floor that can make you trip. What can I do in the kitchen?  Clean up any spills right away.  Avoid walking on wet floors.  Keep items that you use a lot in easy-to-reach places.  If you need to reach something above you, use a strong step stool that has a grab bar.  Keep electrical cords out of the way.  Do not use floor polish or wax that makes floors  slippery. If you must use wax, use non-skid floor wax.  Do not have throw rugs and other things on the floor that can make you trip. What can I do with my stairs?  Do not leave any items on the stairs.  Make sure that there are handrails on both sides of the stairs and use them. Fix handrails that are broken or loose. Make sure that handrails are as long as the stairways.  Check any carpeting to make sure that it is firmly attached to the stairs. Fix any carpet that is loose or worn.  Avoid having throw rugs at the top or bottom of the stairs. If you do have throw rugs, attach them to the floor with carpet tape.  Make sure that you have a light switch at the top of the stairs and the bottom of the stairs. If you do not have them, ask someone to add them for you. What else can I do to help prevent falls?  Wear shoes that:  Do not have high heels.  Have rubber bottoms.  Are comfortable and fit you well.  Are closed  at the toe. Do not wear sandals.  If you use a stepladder:  Make sure that it is fully opened. Do not climb a closed stepladder.  Make sure that both sides of the stepladder are locked into place.  Ask someone to hold it for you, if possible.  Clearly mark and make sure that you can see:  Any grab bars or handrails.  First and last steps.  Where the edge of each step is.  Use tools that help you move around (mobility aids) if they are needed. These include:  Canes.  Walkers.  Scooters.  Crutches.  Turn on the lights when you go into a dark area. Replace any light bulbs as soon as they burn out.  Set up your furniture so you have a clear path. Avoid moving your furniture around.  If any of your floors are uneven, fix them.  If there are any pets around you, be aware of where they are.  Review your medicines with your doctor. Some medicines can make you feel dizzy. This can increase your chance of falling. Ask your doctor what other things that you can do to help prevent falls. This information is not intended to replace advice given to you by your health care provider. Make sure you discuss any questions you have with your health care provider. Document Released: 05/22/2009 Document Revised: 01/01/2016 Document Reviewed: 08/30/2014 Elsevier Interactive Patient Education  2017 [employer].

## 2020-12-29 ENCOUNTER — Encounter: Payer: Self-pay | Admitting: Family Medicine

## 2020-12-29 ENCOUNTER — Telehealth (INDEPENDENT_AMBULATORY_CARE_PROVIDER_SITE_OTHER): Payer: Medicare Other | Admitting: Family Medicine

## 2020-12-29 DIAGNOSIS — N390 Urinary tract infection, site not specified: Secondary | ICD-10-CM | POA: Diagnosis not present

## 2020-12-29 DIAGNOSIS — F5104 Psychophysiologic insomnia: Secondary | ICD-10-CM

## 2020-12-29 NOTE — Progress Notes (Signed)
Virtual Visit via Video Note  Subjective  CC:  Chief Complaint  Patient presents with  . Insomnia    Ambien. States she stopped medications cause she started having hallucinations     I connected with Nadie Fiumara on 12/29/20 at 11:30 AM EDT by a video enabled telemedicine application and verified that I am speaking with the correct person using two identifiers. Location patient: Home Location provider: [facility] [place], Office Persons participating in the virtual visit: Kendrick Remigio, Leamon Arnt, MD [place]  I discussed the limitations of evaluation and management by telemedicine and the availability of in person appointments. The patient expressed understanding and agreed to proceed. HPI: Diane Murphy is a 75 y.o. female who was contacted today to address the problems listed above in the chief complaint.  75 year old with chronic insomnia who recently stopped using Ambien due to auditory hallucinations.  She had been using intermittently.  She has had a long history of sleep problems and we have tried several medications.  See below, excerpt from my note back in May 2021: "I reviewed historical notes regarding her history of insomnia dating back to 2016. Reviewed sleep study results from 2017. Pt complains of persistent sleep problems: now mainly early awakening due to lower leg pain. Has been evaluated by SM for same; reviewed recent MRI report showing diffuse DJD in lumbar spine with associated severe spinal stenosis. She has since had 1 epidural steroid injection which gave her short term relief; also was referred to discuss surgical intervention which she currently declines. She denies RLS or mvt sxs. She has never been a good sleeper. She did try tramadol for pain control: however it kept her awake. She is on gabapentin now titrated up to 300mg  nightly which she believes helps some. Melatonin and trazadone have been used but melatonin is  not helpful and trazadone gave her unacceptable side effects. She cannot afford belsomra. She rarely uses [place] and it helps her. She does not have sleep apnea. " She is currently using valerian root and over-the-counter herbal medication with her gabapentin 300 mg nightly and she is sleeping a little bit better.  Assessment  1. Chronic insomnia      Plan   Chronic insomnia: Agree with discontinuation of Ambien due to auditory hallucination.  This is a high risk medication for her.  She agrees with stopping it.  Continue with valerian root and herbal supplements.  Continue gabapentin 300 mg nightly  Follow up: Complete physical 03/30/2021 No orders of the defined types were placed in this encounter.     I reviewed the patients updated PMH, FH, and SocHx.    Patient Active Problem List   Diagnosis Date Noted  . Lumbar stenosis with neurogenic claudication 12/18/2019    Priority: High  . DJD (degenerative joint disease), lumbar 08/20/2019    Priority: Medium  . Restless leg syndrome 11/06/2018    Priority: Medium  . Osteoarthritis of knee 11/03/2018    Priority: Medium  . Other idiopathic scoliosis, thoracolumbar region 11/03/2018    Priority: Medium  . PLMD (periodic limb movement disorder) 02/16/2016    Priority: Medium  . Gastroesophageal reflux disease without esophagitis 09/09/2015    Priority: Medium  . Primary osteoarthritis involving multiple joints 09/09/2015    Priority: Medium  . Seasonal affective disorder ([place]) 09/09/2015    Priority: Medium  . Osteopenia 09/29/2014    Priority: Medium  . Chronic insomnia 07/30/2014  Priority: Medium  . Bladder prolapse, female, acquired 11/06/2018    Priority: Low  . Colon cancer screening- cologuard neg 2017 07/12/2017    Priority: Low  . No vaccination-pt refuse 07/12/2017    Priority: Low  . Scoliosis deformity of spine 09/23/2020   Current Meds  Medication Sig  . BIOTIN PO Take 10,000 mcg by mouth daily.   .  Calcium Carb-Cholecalciferol (CALCIUM-VITAMIN D3) 600-500 MG-UNIT CAPS Take by mouth.  . Coenzyme Q-10 100 MG capsule Take 100 mg by mouth daily.   . Cranberry-Vit C-Lactobacillus (CRANBERRY/PROBIOTIC/VIT C PO) Take 2 tablets by mouth daily.   Marland Kitchen gabapentin (NEURONTIN) 300 MG capsule Take 300 mg by mouth at bedtime.  . meloxicam (MOBIC) 7.5 MG tablet Take 7.5 mg by mouth daily.  . Multiple Vitamin (MULTIVITAMIN) tablet Take 1 tablet by mouth.  . Omega-3 Fatty Acids (OMEGA-3 FISH OIL PO) Take 1,400 mg by mouth daily.    Allergies: Patient is allergic to ether, requip [ropinirole hcl], and sulfa antibiotics. Family History: Patient family history includes Alcoholism in her mother; Breast cancer (age of onset: 59) in her sister; Depression in her mother; Diabetes in her daughter and father. Social History:  Patient  reports that she has never smoked. She has never used smokeless tobacco. She reports that she does not drink alcohol and does not use drugs.  Review of Systems: Constitutional: Negative for fever malaise or anorexia Cardiovascular: negative for chest pain Respiratory: negative for SOB or persistent cough Gastrointestinal: negative for abdominal pain  OBJECTIVE Vitals: There were no vitals taken for this visit. General: no acute distress , A&Ox3 Looks well Leamon Arnt, MD

## 2021-01-16 ENCOUNTER — Telehealth: Payer: Self-pay

## 2021-01-16 NOTE — Telephone Encounter (Signed)
  LAST APPOINTMENT DATE: 12/29/2020   NEXT APPOINTMENT DATE:@8 /22/2022  MEDICATION:gabapentin (NEURONTIN) 300 MG capsule   PHARMACY: [facility], [place] - [address]   Please advise

## 2021-01-19 ENCOUNTER — Other Ambulatory Visit: Payer: Self-pay

## 2021-01-19 MED ORDER — GABAPENTIN 300 MG PO CAPS: 300.0000 mg | ORAL_CAPSULE | Freq: Every day | ORAL | 3 refills | Status: DC

## 2021-01-19 NOTE — Telephone Encounter (Signed)
Patient stated her and Dr.Andy discussed at her last visit to start taking Gabapentin with tylenol at night to help with pain. States she had remaining refills at her last visit and has used them. Has 4 pills left, will refill medication for patient.

## 2021-01-19 NOTE — Telephone Encounter (Signed)
Patient stated at last appointment she was no longer taking this medication. Will reach out and see what has changed.

## 2021-01-19 NOTE — Telephone Encounter (Signed)
Left voicemail for patient

## 2021-02-06 ENCOUNTER — Other Ambulatory Visit: Payer: Self-pay

## 2021-02-06 MED ORDER — GABAPENTIN 300 MG PO CAPS: 600.0000 mg | ORAL_CAPSULE | Freq: Every day | ORAL | 3 refills | Status: DC

## 2021-02-06 NOTE — Telephone Encounter (Signed)
Spoke to pt, she said that she is having trouble with insomnia and was taking Gabapentin 300 mg one capsule but was not helping so she took two capsules last night and slept for 7 hours. Pt wants to know if she can do that. Told pt will have to send Dr. Jonni Sanger a message and get back to her. Pt verbalized understanding.

## 2021-02-06 NOTE — Telephone Encounter (Signed)
Patient called about asking 2 pills at night. Can someone call her at [phone]? The medication is gabapentin (NEURONTIN) 300 MG capsule

## 2021-02-06 NOTE — Telephone Encounter (Signed)
Dr. Jonni Sanger, please send Rx in for Gabapentin. It will not let us anymore due to considered controlled. I have loaded it. Thanks

## 2021-02-06 NOTE — Telephone Encounter (Signed)
Dr. Jonni Sanger, pt wants to know if okay to increase Gabapentin to two capsules at night? Pt took two last night and slept for 7 hours. Pt said one tablet was not helping at all with insomnia. Please advise if okay to increase Gabapentin 300 mg to two capsules at bedtime?

## 2021-02-10 NOTE — Telephone Encounter (Signed)
Spoke to pt told her Dr. Jonni Sanger sent Rx for Gabapentin 300 mg for you to take 2 capsules at bedtime. Pt verbalized understanding.

## 2021-02-11 DIAGNOSIS — T63481A Toxic effect of venom of other arthropod, accidental (unintentional), initial encounter: Secondary | ICD-10-CM | POA: Diagnosis not present

## 2021-02-12 DIAGNOSIS — L089 Local infection of the skin and subcutaneous tissue, unspecified: Secondary | ICD-10-CM | POA: Diagnosis not present

## 2021-03-24 ENCOUNTER — Ambulatory Visit: Payer: Medicare Other | Admitting: Family Medicine

## 2021-03-27 DIAGNOSIS — G4700 Insomnia, unspecified: Secondary | ICD-10-CM | POA: Diagnosis not present

## 2021-03-27 DIAGNOSIS — M13862 Other specified arthritis, left knee: Secondary | ICD-10-CM | POA: Diagnosis not present

## 2021-03-27 DIAGNOSIS — L853 Xerosis cutis: Secondary | ICD-10-CM | POA: Diagnosis not present

## 2021-03-27 DIAGNOSIS — Z7409 Other reduced mobility: Secondary | ICD-10-CM | POA: Diagnosis not present

## 2021-03-30 ENCOUNTER — Encounter: Payer: Medicare Other | Admitting: Family Medicine

## 2021-03-31 ENCOUNTER — Encounter: Payer: Medicare Other | Admitting: Family Medicine

## 2021-04-20 DIAGNOSIS — N390 Urinary tract infection, site not specified: Secondary | ICD-10-CM | POA: Diagnosis not present

## 2021-04-20 DIAGNOSIS — R3129 Other microscopic hematuria: Secondary | ICD-10-CM | POA: Diagnosis not present

## 2021-05-29 DIAGNOSIS — R300 Dysuria: Secondary | ICD-10-CM | POA: Diagnosis not present

## 2021-05-29 DIAGNOSIS — N390 Urinary tract infection, site not specified: Secondary | ICD-10-CM | POA: Diagnosis not present

## 2021-07-25 DIAGNOSIS — Z23 Encounter for immunization: Secondary | ICD-10-CM | POA: Diagnosis not present

## 2021-12-28 ENCOUNTER — Ambulatory Visit: Payer: Medicare Other

## 2022-04-26 ENCOUNTER — Telehealth: Payer: Self-pay | Admitting: Family Medicine

## 2022-04-26 NOTE — Telephone Encounter (Signed)
Copied from [place] [phone]. Topic: Medicare AWV >> Apr 26, 2022 10:42 AM Devoria Glassing wrote: Reason for CRM: Left message for patient to schedule Annual Wellness Visit.  Please schedule with Nurse Health Advisor Charlott Rakes, RN at [facility]. This appt can be telephone or office visit. Please call [phone] ask for [facility]

## 2023-02-10 LAB — EXTERNAL GENERIC LAB PROCEDURE: COLOGUARD: NEGATIVE

## 2023-02-10 LAB — COLOGUARD: COLOGUARD: NEGATIVE

## 2023-10-10 ENCOUNTER — Ambulatory Visit: Payer: Self-pay | Admitting: Urology

## 2023-11-07 ENCOUNTER — Ambulatory Visit: Payer: Self-pay | Admitting: Urology

## 2023-11-07 DIAGNOSIS — R351 Nocturia: Secondary | ICD-10-CM | POA: Diagnosis not present

## 2023-11-07 DIAGNOSIS — N8110 Cystocele, unspecified: Secondary | ICD-10-CM | POA: Diagnosis not present

## 2023-11-07 LAB — URINALYSIS, COMPLETE
Bilirubin, UA: NEGATIVE
Glucose, UA: NEGATIVE
Ketones, UA: NEGATIVE
Nitrite, UA: NEGATIVE
Protein,UA: NEGATIVE
RBC, UA: NEGATIVE
Specific Gravity, UA: 1.015 (ref 1.005–1.030)
Urobilinogen, Ur: 0.2 mg/dL (ref 0.2–1.0)
pH, UA: 7.5 (ref 5.0–7.5)

## 2023-11-07 LAB — MICROSCOPIC EXAMINATION

## 2023-11-07 MED ORDER — GEMTESA 75 MG PO TABS
75.0000 mg | ORAL_TABLET | Freq: Every day | ORAL | Status: AC
Start: 2023-11-07 — End: 2023-12-05

## 2023-11-07 MED ORDER — GEMTESA 75 MG PO TABS: 75.0000 mg | ORAL_TABLET | Freq: Every day | ORAL | 11 refills | Status: AC

## 2023-11-07 NOTE — Patient Instructions (Signed)
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
## 2023-11-11 ENCOUNTER — Other Ambulatory Visit: Payer: Self-pay

## 2023-11-11 ENCOUNTER — Telehealth: Payer: Self-pay

## 2023-11-11 LAB — CULTURE, URINE COMPREHENSIVE

## 2023-11-11 MED ORDER — NITROFURANTOIN MACROCRYSTAL 100 MG PO CAPS: 100.0000 mg | ORAL_CAPSULE | Freq: Two times a day (BID) | ORAL | 0 refills | Status: AC

## 2023-11-11 NOTE — Telephone Encounter (Signed)
 Patient called on 11/11/23 at 3:58 pm and left a message stating she forgot to mention that when she takes antibiotics she gets a yeast infection/break out. She would like to get medication for that and she will wait to hear back from [place] and will not start antibiotic until she gets the medication to take for the break out please.

## 2023-11-14 ENCOUNTER — Other Ambulatory Visit: Payer: Self-pay

## 2023-11-14 DIAGNOSIS — R351 Nocturia: Secondary | ICD-10-CM

## 2023-11-14 MED ORDER — FLUCONAZOLE 100 MG PO TABS
100.0000 mg | ORAL_TABLET | Freq: Every day | ORAL | 0 refills | Status: AC
Start: 2023-11-14 — End: 2023-11-17

## 2023-11-14 NOTE — Telephone Encounter (Signed)
 Patient called again requesting a medication for her break outs that she develops when on antibiotics. Patient stated that she did start her antibiotics this morning and medication can be sent to [facility]. Please advise patient.

## 2023-11-14 NOTE — Telephone Encounter (Signed)
 Patient called again to request medication for prevention of yeast infection/breakout that she gets from taking antibiotics. Please advise patient.

## 2023-11-14 NOTE — Telephone Encounter (Signed)
 Harlow Ohms informed pt of medication sent.

## 2024-01-30 ENCOUNTER — Other Ambulatory Visit: Admitting: Urology

## 2024-04-30 ENCOUNTER — Other Ambulatory Visit: Admitting: Urology

## 2024-07-02 ENCOUNTER — Other Ambulatory Visit: Admitting: Urology
